# Patient Record
Sex: Male | Born: 1950
Health system: Southern US, Community
[De-identification: ages and names within clinical notes are randomized; demographics above are authoritative.]

## PROBLEM LIST (undated history)

## (undated) DIAGNOSIS — F1011 Alcohol abuse, in remission: Secondary | ICD-10-CM

## (undated) DIAGNOSIS — F329 Major depressive disorder, single episode, unspecified: Secondary | ICD-10-CM

## (undated) DIAGNOSIS — G4700 Insomnia, unspecified: Secondary | ICD-10-CM

## (undated) DIAGNOSIS — B1920 Unspecified viral hepatitis C without hepatic coma: Secondary | ICD-10-CM

## (undated) DIAGNOSIS — B20 Human immunodeficiency virus [HIV] disease: Secondary | ICD-10-CM

## (undated) DIAGNOSIS — C329 Malignant neoplasm of larynx, unspecified: Secondary | ICD-10-CM

## (undated) DIAGNOSIS — R7401 Elevation of levels of liver transaminase levels: Secondary | ICD-10-CM

## (undated) DIAGNOSIS — R740 Nonspecific elevation of levels of transaminase and lactic acid dehydrogenase [LDH]: Secondary | ICD-10-CM

## (undated) DIAGNOSIS — F32A Depression, unspecified: Secondary | ICD-10-CM

## (undated) DIAGNOSIS — Z21 Asymptomatic human immunodeficiency virus [HIV] infection status: Secondary | ICD-10-CM

## (undated) DIAGNOSIS — F419 Anxiety disorder, unspecified: Secondary | ICD-10-CM

## (undated) DIAGNOSIS — R911 Solitary pulmonary nodule: Secondary | ICD-10-CM

## (undated) HISTORY — DX: Solitary pulmonary nodule: R91.1

## (undated) HISTORY — DX: Alcohol abuse, in remission: F10.11

## (undated) HISTORY — DX: Human immunodeficiency virus (HIV) disease: B20

## (undated) HISTORY — DX: Depression, unspecified: F32.A

## (undated) HISTORY — DX: Major depressive disorder, single episode, unspecified: F32.9

## (undated) HISTORY — DX: Elevation of levels of liver transaminase levels: R74.01

## (undated) HISTORY — DX: Nonspecific elevation of levels of transaminase and lactic acid dehydrogenase (ldh): R74.0

## (undated) HISTORY — DX: Insomnia, unspecified: G47.00

## (undated) HISTORY — DX: Asymptomatic human immunodeficiency virus (hiv) infection status: Z21

## (undated) HISTORY — DX: Malignant neoplasm of larynx, unspecified: C32.9

## (undated) HISTORY — PX: HERNIA REPAIR: SHX51

## (undated) HISTORY — DX: Anxiety disorder, unspecified: F41.9

## (undated) HISTORY — DX: Unspecified viral hepatitis C without hepatic coma: B19.20

---

## 2008-03-07 ENCOUNTER — Emergency Department (HOSPITAL_COMMUNITY): Admission: EM | Admit: 2008-03-07 | Discharge: 2008-03-07 | Payer: Self-pay | Admitting: Emergency Medicine

## 2008-03-09 ENCOUNTER — Emergency Department (HOSPITAL_COMMUNITY): Admission: EM | Admit: 2008-03-09 | Discharge: 2008-03-09 | Payer: Self-pay | Admitting: Emergency Medicine

## 2008-06-26 ENCOUNTER — Emergency Department (HOSPITAL_COMMUNITY): Admission: EM | Admit: 2008-06-26 | Discharge: 2008-06-26 | Payer: Self-pay | Admitting: Emergency Medicine

## 2008-09-09 ENCOUNTER — Emergency Department (HOSPITAL_COMMUNITY): Admission: EM | Admit: 2008-09-09 | Discharge: 2008-09-09 | Payer: Self-pay | Admitting: Emergency Medicine

## 2008-11-24 ENCOUNTER — Encounter: Admission: RE | Admit: 2008-11-24 | Discharge: 2008-11-24 | Payer: Self-pay | Admitting: Infectious Diseases

## 2008-11-29 ENCOUNTER — Encounter: Admission: RE | Admit: 2008-11-29 | Discharge: 2008-11-29 | Payer: Self-pay | Admitting: Infectious Diseases

## 2008-11-29 ENCOUNTER — Ambulatory Visit: Payer: Self-pay | Admitting: Internal Medicine

## 2008-11-29 LAB — CONVERTED CEMR LAB
ALT: 14 units/L (ref 0–53)
Absolute CD4: 415 #/uL (ref 381–1469)
CD4 T Helper %: 25 % — ABNORMAL LOW (ref 32–62)
CO2: 23 meq/L (ref 19–32)
Creatinine, Ser: 0.83 mg/dL (ref 0.40–1.50)
GC Probe Amp, Urine: NEGATIVE
HCV Ab: REACTIVE — AB
HIV-1 RNA Quant, Log: 4.17 — ABNORMAL HIGH (ref <1.68)
Hep A IgM: NEGATIVE
Hep B C IgM: NEGATIVE
Hepatitis B Surface Ag: NEGATIVE
Total Bilirubin: 0.3 mg/dL (ref 0.3–1.2)

## 2008-11-30 ENCOUNTER — Encounter: Payer: Self-pay | Admitting: Internal Medicine

## 2008-12-01 ENCOUNTER — Ambulatory Visit: Payer: Self-pay | Admitting: Internal Medicine

## 2008-12-01 ENCOUNTER — Encounter: Payer: Self-pay | Admitting: Internal Medicine

## 2008-12-01 DIAGNOSIS — K4090 Unilateral inguinal hernia, without obstruction or gangrene, not specified as recurrent: Secondary | ICD-10-CM | POA: Insufficient documentation

## 2008-12-01 DIAGNOSIS — F3289 Other specified depressive episodes: Secondary | ICD-10-CM | POA: Insufficient documentation

## 2008-12-01 DIAGNOSIS — F329 Major depressive disorder, single episode, unspecified: Secondary | ICD-10-CM

## 2008-12-01 DIAGNOSIS — J984 Other disorders of lung: Secondary | ICD-10-CM

## 2008-12-05 ENCOUNTER — Ambulatory Visit: Payer: Self-pay | Admitting: *Deleted

## 2008-12-06 ENCOUNTER — Ambulatory Visit (HOSPITAL_COMMUNITY): Admission: RE | Admit: 2008-12-06 | Discharge: 2008-12-06 | Payer: Self-pay | Admitting: Internal Medicine

## 2008-12-07 ENCOUNTER — Telehealth: Payer: Self-pay | Admitting: Internal Medicine

## 2008-12-21 ENCOUNTER — Ambulatory Visit: Payer: Self-pay | Admitting: Internal Medicine

## 2009-01-09 ENCOUNTER — Encounter: Admission: RE | Admit: 2009-01-09 | Discharge: 2009-01-09 | Payer: Self-pay | Admitting: Orthopedic Surgery

## 2009-01-17 ENCOUNTER — Encounter: Payer: Self-pay | Admitting: Internal Medicine

## 2009-02-01 ENCOUNTER — Telehealth: Payer: Self-pay | Admitting: Internal Medicine

## 2009-02-23 ENCOUNTER — Ambulatory Visit: Payer: Self-pay | Admitting: Internal Medicine

## 2009-02-23 ENCOUNTER — Encounter: Payer: Self-pay | Admitting: Internal Medicine

## 2009-02-23 DIAGNOSIS — B1710 Acute hepatitis C without hepatic coma: Secondary | ICD-10-CM | POA: Insufficient documentation

## 2009-02-23 DIAGNOSIS — M25519 Pain in unspecified shoulder: Secondary | ICD-10-CM | POA: Insufficient documentation

## 2009-02-23 LAB — CONVERTED CEMR LAB
ALT: 19 units/L (ref 0–53)
AST: 26 units/L (ref 0–37)
Absolute CD4: 425 #/uL (ref 381–1469)
Albumin: 4.3 g/dL (ref 3.5–5.2)
Alkaline Phosphatase: 77 units/L (ref 39–117)
BUN: 9 mg/dL (ref 6–23)
Basophils Absolute: 0.1 10*3/uL (ref 0.0–0.1)
CD4 T Helper %: 20 % — ABNORMAL LOW (ref 32–62)
Eosinophils Relative: 2 % (ref 0–5)
Lymphocytes Relative: 25 % (ref 12–46)
Lymphs Abs: 2.1 10*3/uL (ref 0.7–4.0)
Neutro Abs: 5.5 10*3/uL (ref 1.7–7.7)
Neutrophils Relative %: 65 % (ref 43–77)
Platelets: 284 10*3/uL (ref 150–400)
Potassium: 4.2 meq/L (ref 3.5–5.3)
RDW: 13.1 % (ref 11.5–15.5)
Sodium: 135 meq/L (ref 135–145)
Total lymphocyte count: 2125 cells/mcL (ref 700–3300)
WBC: 8.5 10*3/uL (ref 4.0–10.5)

## 2009-03-02 ENCOUNTER — Telehealth: Payer: Self-pay | Admitting: Internal Medicine

## 2009-03-07 ENCOUNTER — Ambulatory Visit (HOSPITAL_COMMUNITY): Admission: RE | Admit: 2009-03-07 | Discharge: 2009-03-07 | Payer: Self-pay | Admitting: Internal Medicine

## 2009-03-16 ENCOUNTER — Ambulatory Visit: Payer: Self-pay | Admitting: Internal Medicine

## 2009-03-16 ENCOUNTER — Encounter: Payer: Self-pay | Admitting: Internal Medicine

## 2009-03-16 DIAGNOSIS — R221 Localized swelling, mass and lump, neck: Secondary | ICD-10-CM

## 2009-03-16 DIAGNOSIS — R220 Localized swelling, mass and lump, head: Secondary | ICD-10-CM | POA: Insufficient documentation

## 2009-03-29 ENCOUNTER — Emergency Department (HOSPITAL_COMMUNITY): Admission: EM | Admit: 2009-03-29 | Discharge: 2009-03-29 | Payer: Self-pay | Admitting: Emergency Medicine

## 2009-04-06 ENCOUNTER — Other Ambulatory Visit: Admission: RE | Admit: 2009-04-06 | Discharge: 2009-04-06 | Payer: Self-pay | Admitting: Otolaryngology

## 2009-04-11 ENCOUNTER — Ambulatory Visit (HOSPITAL_COMMUNITY): Admission: RE | Admit: 2009-04-11 | Discharge: 2009-04-11 | Payer: Self-pay | Admitting: Otolaryngology

## 2009-04-28 ENCOUNTER — Encounter: Payer: Self-pay | Admitting: Internal Medicine

## 2009-06-02 HISTORY — PX: OTHER SURGICAL HISTORY: SHX169

## 2009-06-19 ENCOUNTER — Ambulatory Visit (HOSPITAL_COMMUNITY): Admission: RE | Admit: 2009-06-19 | Discharge: 2009-06-19 | Payer: Self-pay | Admitting: Otolaryngology

## 2009-07-06 ENCOUNTER — Encounter: Payer: Self-pay | Admitting: Internal Medicine

## 2009-07-14 ENCOUNTER — Ambulatory Visit: Payer: Self-pay | Admitting: Internal Medicine

## 2009-07-14 ENCOUNTER — Ambulatory Visit: Payer: Self-pay | Admitting: Oncology

## 2009-07-14 ENCOUNTER — Ambulatory Visit: Admission: RE | Admit: 2009-07-14 | Discharge: 2009-08-21 | Payer: Self-pay | Admitting: Radiation Oncology

## 2009-07-14 LAB — CONVERTED CEMR LAB
ALT: 37 units/L (ref 0–53)
Alkaline Phosphatase: 117 units/L (ref 39–117)
Basophils Absolute: 0.1 10*3/uL (ref 0.0–0.1)
Basophils Relative: 1 % (ref 0–1)
HIV 1 RNA Quant: <48 copies/mL (ref <48)
HIV-1 RNA Quant, Log: <1.68 (ref <1.68)
LDL Cholesterol: 78 mg/dL (ref 0–99)
MCHC: 32.8 g/dL (ref 30.0–36.0)
Monocytes Relative: 5 % (ref 3–12)
Neutro Abs: 4.7 10*3/uL (ref 1.7–7.7)
Neutrophils Relative %: 72 % (ref 43–77)
RBC: 4.84 M/uL (ref 4.22–5.81)
RDW: 15.5 % (ref 11.5–15.5)
Sodium: 137 meq/L (ref 135–145)
Total Bilirubin: 0.2 mg/dL — ABNORMAL LOW (ref 0.3–1.2)
Total Protein: 8 g/dL (ref 6.0–8.3)
VLDL: 27 mg/dL (ref 0–40)

## 2009-07-17 LAB — CBC WITH DIFFERENTIAL/PLATELET
BASO%: 0.3 % (ref 0.0–2.0)
Basophils Absolute: 0 10*3/uL (ref 0.0–0.1)
EOS%: 2.4 % (ref 0.0–7.0)
HCT: 45.5 % (ref 38.4–49.9)
HGB: 15.2 g/dL (ref 13.0–17.1)
LYMPH%: 21.9 % (ref 14.0–49.0)
MCH: 31.9 pg (ref 27.2–33.4)
MCHC: 33.4 g/dL (ref 32.0–36.0)
NEUT%: 70.5 % (ref 39.0–75.0)
Platelets: 263 10*3/uL (ref 140–400)

## 2009-07-17 LAB — COMPREHENSIVE METABOLIC PANEL
ALT: 47 U/L (ref 0–53)
AST: 64 U/L — ABNORMAL HIGH (ref 0–37)
BUN: 11 mg/dL (ref 6–23)
CO2: 25 mEq/L (ref 19–32)
Calcium: 9.2 mg/dL (ref 8.4–10.5)
Creatinine, Ser: 0.92 mg/dL (ref 0.40–1.50)
Total Bilirubin: 0.3 mg/dL (ref 0.3–1.2)

## 2009-07-17 LAB — ETHANOL: Alcohol, Ethyl (B): <10 mg/dL (ref 0–10)

## 2009-07-20 ENCOUNTER — Ambulatory Visit (HOSPITAL_COMMUNITY): Admission: RE | Admit: 2009-07-20 | Discharge: 2009-07-20 | Payer: Self-pay | Admitting: Oncology

## 2009-07-24 ENCOUNTER — Ambulatory Visit: Payer: Self-pay | Admitting: Dentistry

## 2009-07-24 ENCOUNTER — Encounter: Admission: AD | Admit: 2009-07-24 | Discharge: 2009-07-24 | Payer: Self-pay | Admitting: Dentistry

## 2009-07-24 ENCOUNTER — Encounter (INDEPENDENT_AMBULATORY_CARE_PROVIDER_SITE_OTHER): Payer: Self-pay | Admitting: *Deleted

## 2009-07-24 ENCOUNTER — Ambulatory Visit: Payer: Self-pay | Admitting: Internal Medicine

## 2009-07-24 DIAGNOSIS — C140 Malignant neoplasm of pharynx, unspecified: Secondary | ICD-10-CM | POA: Insufficient documentation

## 2009-08-02 HISTORY — PX: OTHER SURGICAL HISTORY: SHX169

## 2009-08-09 ENCOUNTER — Encounter: Admission: RE | Admit: 2009-08-09 | Discharge: 2009-11-07 | Payer: Self-pay | Admitting: Otolaryngology

## 2009-08-11 ENCOUNTER — Encounter (INDEPENDENT_AMBULATORY_CARE_PROVIDER_SITE_OTHER): Payer: Self-pay | Admitting: Otolaryngology

## 2009-08-11 ENCOUNTER — Inpatient Hospital Stay (HOSPITAL_COMMUNITY): Admission: RE | Admit: 2009-08-11 | Discharge: 2009-08-20 | Payer: Self-pay | Admitting: Otolaryngology

## 2009-10-02 ENCOUNTER — Ambulatory Visit: Payer: Self-pay | Admitting: Oncology

## 2009-10-16 ENCOUNTER — Ambulatory Visit: Payer: Self-pay | Admitting: Internal Medicine

## 2009-10-16 LAB — CONVERTED CEMR LAB
ALT: 36 units/L (ref 0–53)
AST: 36 units/L (ref 0–37)
Basophils Absolute: 0 10*3/uL (ref 0.0–0.1)
CO2: 26 meq/L (ref 19–32)
Eosinophils Relative: 3 % (ref 0–5)
HCT: 40.3 % (ref 39.0–52.0)
HIV 1 RNA Quant: <48 copies/mL (ref <48)
Lymphocytes Relative: 11 % — ABNORMAL LOW (ref 12–46)
Neutro Abs: 6.5 10*3/uL (ref 1.7–7.7)
Platelets: 293 10*3/uL (ref 150–400)
RDW: 12.9 % (ref 11.5–15.5)
Sodium: 137 meq/L (ref 135–145)
Total Bilirubin: 0.3 mg/dL (ref 0.3–1.2)
Total Protein: 7.8 g/dL (ref 6.0–8.3)

## 2009-10-30 ENCOUNTER — Ambulatory Visit: Payer: Self-pay | Admitting: Internal Medicine

## 2009-10-30 DIAGNOSIS — G4700 Insomnia, unspecified: Secondary | ICD-10-CM

## 2009-10-30 DIAGNOSIS — R21 Rash and other nonspecific skin eruption: Secondary | ICD-10-CM

## 2009-12-19 ENCOUNTER — Encounter: Payer: Self-pay | Admitting: Internal Medicine

## 2009-12-19 ENCOUNTER — Ambulatory Visit: Payer: Self-pay | Admitting: Internal Medicine

## 2010-01-02 HISTORY — PX: OTHER SURGICAL HISTORY: SHX169

## 2010-01-04 ENCOUNTER — Ambulatory Visit (HOSPITAL_COMMUNITY): Admission: RE | Admit: 2010-01-04 | Discharge: 2010-01-05 | Payer: Self-pay | Admitting: Otolaryngology

## 2010-01-29 ENCOUNTER — Ambulatory Visit: Payer: Self-pay | Admitting: Internal Medicine

## 2010-01-29 DIAGNOSIS — N529 Male erectile dysfunction, unspecified: Secondary | ICD-10-CM | POA: Insufficient documentation

## 2010-01-29 LAB — CONVERTED CEMR LAB
ALT: 81 units/L — ABNORMAL HIGH (ref 0–53)
AST: 66 units/L — ABNORMAL HIGH (ref 0–37)
Alkaline Phosphatase: 168 units/L — ABNORMAL HIGH (ref 39–117)
Basophils Absolute: 0 10*3/uL (ref 0.0–0.1)
Basophils Relative: 1 % (ref 0–1)
Creatinine, Ser: 0.79 mg/dL (ref 0.40–1.50)
Eosinophils Absolute: 0.2 10*3/uL (ref 0.0–0.7)
Eosinophils Relative: 3 % (ref 0–5)
HCT: 41 % (ref 39.0–52.0)
Hemoglobin: 13.7 g/dL (ref 13.0–17.0)
MCHC: 33.4 g/dL (ref 30.0–36.0)
MCV: 96.2 fL (ref 78.0–100.0)
Monocytes Absolute: 0.5 10*3/uL (ref 0.1–1.0)
Neutro Abs: 3.2 10*3/uL (ref 1.7–7.7)
RDW: 14.5 % (ref 11.5–15.5)
Total Bilirubin: 0.4 mg/dL (ref 0.3–1.2)

## 2010-02-04 ENCOUNTER — Inpatient Hospital Stay (HOSPITAL_COMMUNITY)
Admission: EM | Admit: 2010-02-04 | Discharge: 2010-02-09 | Payer: Self-pay | Source: Home / Self Care | Attending: Internal Medicine | Admitting: Internal Medicine

## 2010-02-05 ENCOUNTER — Encounter: Payer: Self-pay | Admitting: Internal Medicine

## 2010-02-09 ENCOUNTER — Encounter: Payer: Self-pay | Admitting: Internal Medicine

## 2010-02-21 ENCOUNTER — Encounter: Payer: Self-pay | Admitting: Internal Medicine

## 2010-02-21 ENCOUNTER — Ambulatory Visit: Payer: Self-pay | Admitting: Internal Medicine

## 2010-02-21 DIAGNOSIS — S2239XA Fracture of one rib, unspecified side, initial encounter for closed fracture: Secondary | ICD-10-CM | POA: Insufficient documentation

## 2010-02-21 DIAGNOSIS — Z9189 Other specified personal risk factors, not elsewhere classified: Secondary | ICD-10-CM | POA: Insufficient documentation

## 2010-02-21 LAB — CONVERTED CEMR LAB
Amphetamine Screen, Ur: NEGATIVE
Cocaine Metabolites: NEGATIVE
Marijuana Metabolite: NEGATIVE
Opiates: POSITIVE — AB
Phencyclidine (PCP): NEGATIVE
Propoxyphene: NEGATIVE

## 2010-03-07 ENCOUNTER — Encounter: Payer: Self-pay | Admitting: Internal Medicine

## 2010-03-08 ENCOUNTER — Encounter: Payer: Self-pay | Admitting: Licensed Clinical Social Worker

## 2010-03-09 ENCOUNTER — Ambulatory Visit
Admission: RE | Admit: 2010-03-09 | Discharge: 2010-03-09 | Payer: Self-pay | Source: Home / Self Care | Attending: Internal Medicine | Admitting: Internal Medicine

## 2010-03-22 ENCOUNTER — Telehealth: Payer: Self-pay | Admitting: Internal Medicine

## 2010-03-23 ENCOUNTER — Ambulatory Visit
Admission: RE | Admit: 2010-03-23 | Discharge: 2010-03-23 | Payer: Self-pay | Source: Home / Self Care | Attending: Internal Medicine | Admitting: Internal Medicine

## 2010-03-23 ENCOUNTER — Encounter: Payer: Self-pay | Admitting: Internal Medicine

## 2010-03-25 ENCOUNTER — Encounter: Payer: Self-pay | Admitting: Radiation Oncology

## 2010-04-02 LAB — CONVERTED CEMR LAB
Amphetamine Screen, Ur: NEGATIVE
Barbiturate Quant, Ur: NEGATIVE
Cocaine Metabolites: NEGATIVE
Marijuana Metabolite: NEGATIVE
Opiates: NEGATIVE

## 2010-04-03 NOTE — Miscellaneous (Signed)
Summary: Office Visit ([facility])    Vital Signs:  Patient profile:   60 year old male Weight:      139.8 pounds Temp:     97.9 degrees F oral Pulse rate:   73 / minute Pulse rhythm:   regular Resp:     20 per minute BP sitting:   133 / 90  (left arm)  Vitals Entered By: Sharen Heck RN (March 16, 2009 3:23 PM) CC: f/u 05, wants to discuus 05 meds, probs with stinging in throat whwn swallowing, reports intermitten bil arm pain Pain Assessment Patient in pain? yes     Location: arms Intensity: 6 Type: aching  Does patient need assistance? Functional Status Self care Ambulation Normal   CC:  f/u 05, wants to discuus 05 meds, probs with stinging in throat whwn swallowing, and reports intermitten bil arm pain.  History of Present Illness: Pt here for PET scan results and genotype result.s  He c/o pain on the left side of his throat. No fever. he is ready to get started on HIV meds. He states that his sputum for AFB x 3 was negative.  Preventive Screening-Counseling & Management  Alcohol-Tobacco     Alcohol drinks/day: 2     Alcohol type: beer     Alcohol Counseling: to decrease amount and/or frequency of alcohol intake     Smoking Status: current     Packs/Day: 1.0     Pack years: 45 years     Passive Smoke Exposure: No  Caffeine-Diet-Exercise     Caffeine use/day: 5 cups     Does Patient Exercise: no  Allergies (verified): No Known Drug Allergies   Review of Systems  The patient denies anorexia, fever, and weight loss.     Physical Exam  General:  alert, well-developed, well-nourished, and well-hydrated.   Head:  normocephalic and no abnormalities observed.   Mouth:  pharynx pink and moist.  no thrush  Neck:  fullness left side of neck Lungs:  normal breath sounds.     Impression & Recommendations:  Problem # 1:  POSITIVE PPD (ICD-795.5) no sign of active disease health dept following  Problem # 2:  Hosp for HIV INFECTION, ASYMPTOMATIC  (ICD-V08)  genotype negative for resistance.  Pt ready to start meds.  Treatment options discussed.  Will start Atripla.  potential side effects discussed. Will return in 6 weeks for labs.  Diagnostics Reviewed:  WBC: 8.5 (02/23/2009)   Hgb: 16.7 (02/23/2009)   HCT: 49.2 (02/23/2009)   Platelets: 284 (02/23/2009) HIV genotype: See Comment (02/23/2009)   HIV-1 RNA: 17200 (02/23/2009)   HBSAg: NEG (11/29/2008)  Orders: Est. Patient Level III (65784)  Problem # 3:  NECK MASS (ICD-784.2)  with increased activity on PET scan. Will refer to ENT   Orders: Est. Patient Level III (69629) ENT Referral (ENT)  Medications Added to Medication List This Visit: 1)  Atripla 600-200-300 Mg Tabs (Efavirenz-emtricitab-tenofovir) .... Take 1 tablet by mouth at bedtime   Patient Instructions: 1)  Please schedule a follow-up appointment in 6 weeks. Prescriptions: ATRIPLA 600-200-300 MG TABS (EFAVIRENZ-EMTRICITAB-TENOFOVIR) Take 1 tablet by mouth at bedtime  #30 x 5   Entered and Authorized by:   Yisroel Ramming MD   Signed by:   Yisroel Ramming MD on 03/16/2009   Method used:   Print then Give to Patient   RxID:   [id number]

## 2010-04-03 NOTE — Miscellaneous (Signed)
Summary: Orders Update - labs  Clinical Lists Changes  Problems: Added new problem of ENCOUNTER FOR LONG-TERM USE OF OTHER MEDICATIONS (ICD-V58.69) Orders: Added new Test order of T-CBC w/Diff [phone]) - Signed Added new Test order of T-CD4SP [facility]) (CD4SP) - Signed Added new Test order of T-Comprehensive Metabolic Panel [phone]) - Signed Added new Test order of T-HIV Viral Load [phone]) - Signed Added new Test order of T-RPR (Syphilis) [phone]) - Signed Added new Test order of T-Lipid Profile ([id number]) - Signed     Process Orders Check Orders Results:     [facility]: ABN not required for this insurance Order queued for requisitioning for [facility]: Jul 06, 2009 5:08 PM  Tests Sent for requisitioning (Jul 06, 2009 5:08 PM):     07/10/2009: [facility] -- T-CBC w/Diff [[id number]] (signed)     07/10/2009: [facility] -- T-Comprehensive Metabolic Panel [[id number]] (signed)     07/10/2009: [facility] -- T-HIV Viral Load [phone] (signed)     07/10/2009: [facility] -- T-RPR (Syphilis) [phone] (signed)     07/10/2009: [facility] -- T-Lipid Profile [phone] (signed)

## 2010-04-03 NOTE — Letter (Signed)
Summary: [facility]   Imported By: Percell Miller 04/28/2009 14:59:25  _____________________________________________________________________  External Attachment:    Type:   Image     Comment:   External Document

## 2010-04-03 NOTE — Letter (Signed)
Summary: Internal Correspondence - Referral  Internal Correspondence - Referral   Imported By: Paula Libra 03/23/2009 15:34:22  _____________________________________________________________________  External Attachment:    Type:   Image     Comment:   External Document

## 2010-04-03 NOTE — Assessment & Plan Note (Signed)
Summary: F/U/VS   CC:  follow-up visit, lab results, and c/o trouble sleeping even with Ambien.  History of Present Illness: Pt here for lab results.He had his larynx removed due to the cancer.  No further chemo or RT is planned.  He is having trouble sleeping and the Remus Loffler is not working. He also has a pruritic rash on his hands and feet.  Preventive Screening-Counseling & Management  Alcohol-Tobacco     Alcohol drinks/day: 2     Alcohol type: beer     Alcohol Counseling: to decrease amount and/or frequency of alcohol intake     Smoking Status: current     Packs/Day: 1.0     Pack years: 45 years     Passive Smoke Exposure: No  Caffeine-Diet-Exercise     Caffeine use/day: 5 cups     Does Patient Exercise: no  Safety-Violence-Falls     Seat Belt Use: yes      Drug Use:  yes.    Comments: pt. given condoms   Updated Prior Medication List: ZOLOFT 50 MG TABS (SERTRALINE HCL) Take 1 tablet by mouth once a day NAPROSYN 500 MG TABS (NAPROXEN) Take 1 tablet by mouth two times a day as needed ULTRAM 50 MG TABS (TRAMADOL HCL) Take 1 tablet by mouth every 8 hours as needed ATRIPLA 600-200-300 MG TABS (EFAVIRENZ-EMTRICITAB-TENOFOVIR) Take 1 tablet by mouth at bedtime LORAZEPAM 0.5 MG TABS (LORAZEPAM) take one tablet by mouth every six hours as needed for nausea and vomiting PROCHLORPERAZINE MALEATE 10 MG TABS (PROCHLORPERAZINE MALEATE) take one tablet by mouth every six hours as needed for nausea and vomiting ONDANSETRON HCL 4 MG TABS (ONDANSETRON HCL) take one tablet by mouth every 12 hours as needed for nausea and vomiting ENSURE  LIQD (NUTRITIONAL SUPPLEMENTS) one can two times a day VALIUM 5 MG TABS (DIAZEPAM) Take 1 tablet by mouth at bedtime LOTRISONE 1-0.05 % CREA (CLOTRIMAZOLE-BETAMETHASONE) apply two times a day  Current Allergies (reviewed today): No known allergies  Past History:  Past Medical History: Last updated: 02/23/2009 Hepatitis C  Review of  Systems  The patient denies fever, chest pain, and dyspnea on exertion.    Vital Signs:  Patient profile:   60 year old male Height:      63 inches (160.02 cm) Weight:      137.8 pounds (62.64 kg) BMI:     24.50 Temp:     97.8 degrees F (36.56 degrees C) oral BP sitting:   144 / 87  (right arm)  Vitals Entered By: Wendall Mola CMA Duncan Dull) (October 30, 2009 2:04 PM) CC: follow-up visit, lab results, c/o trouble sleeping even with Ambien Is Patient Diabetic? No Pain Assessment Patient in pain? no      Nutritional Status BMI of 19 -24 = normal Nutritional Status Detail appetite "good"  Does patient need assistance? Functional Status Self care Ambulation Normal Comments no missed doses of meds per patient pt. requesting Valium in place of Ambien   Physical Exam  General:  alert, well-developed, well-nourished, and well-hydrated.   Head:  normocephalic and atraumatic.   Mouth:  pharynx pink and moist.   Lungs:  normal breath sounds.      Impression & Recommendations:  Problem # 1:  Hosp for HIV INFECTION, ASYMPTOMATIC (ICD-V08) Pt.s most recent CD4ct was 220 and VL <48 .  Pt instructed to continue the current antiretroviral regimen.  Pt encouraged to take medication regularly and not miss doses.  Pt will f/u in 3 months  for repeat blood work and will see me 2 weeks later.  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 Apply two times a day  Problem # 4:  INSOMNIA (ICD-780.52) trial of  valium  Medications Added to Medication List This Visit: 1)  Valium 5 Mg Tabs (Diazepam) .... Take 1 tablet by mouth at bedtime 2)  Lotrisone 1-0.05 % Crea (Clotrimazole-betamethasone) .... Apply two times a day  Other Orders: Est. Patient Level III (16109) Future Orders: T-CD4SP ([facility]) (CD4SP) ... 01/28/2010 T-HIV Viral Load [phone]) ... 01/28/2010 T-Comprehensive Metabolic Panel [phone]) ... 01/28/2010 T-CBC w/Diff ([id number]) ... 01/28/2010  Patient Instructions: 1)  Please schedule a follow-up appointment in 3 months, 2 weeks afte rlabs.  Prescriptions: LOTRISONE 1-0.05 % CREA (CLOTRIMAZOLE-BETAMETHASONE) apply two times a day  #60gm x 0   Entered and Authorized by:   Yisroel Ramming MD   Signed by:   Yisroel Ramming MD on 10/30/2009   Method used:   Print then Give to Patient   RxID:   [id number] VALIUM 5 MG TABS (DIAZEPAM) Take 1 tablet by mouth at bedtime  #30 x 5   Entered and Authorized by:   Yisroel Ramming MD   Signed by:   Yisroel Ramming MD on 10/30/2009   Method used:   Print then Give to Patient   RxID:   [id number]

## 2010-04-03 NOTE — Miscellaneous (Signed)
Summary: clinical update/ryan white  Clinical Lists Changes  Observations: Added new observation of AIDSDAP: Yes 2011 (07/24/2009 10:42) Added new observation of PCTFPL: 67.97  (07/24/2009 10:42) Added new observation of INCOMESOURCE: Unemployment  (07/24/2009 10:42) Added new observation of HOUSEINCOME: 7361  (07/24/2009 10:42) Added new observation of FAMILYSIZE: 1  (07/24/2009 10:42) Added new observation of HOUSING: Stable/permanent  (07/24/2009 10:42) Added new observation of FINASSESSDT: 07/24/2009  (07/24/2009 10:42) Added new observation of CASE MGM: Tammi  (07/24/2009 10:42)

## 2010-04-03 NOTE — Assessment & Plan Note (Signed)
Summary: Nurse Visit  - Flu vaccine   Vital Signs:  Patient profile:   60 year old male Height:      63 inches (160.02 cm) Weight:      145 pounds (65.91 kg) BMI:     25.78  Vitals Entered By: Jennet Maduro RN (December 19, 2009 2:29 PM) Prior Medications: ZOLOFT 50 MG TABS (SERTRALINE HCL) Take 1 tablet by mouth once a day NAPROSYN 500 MG TABS (NAPROXEN) Take 1 tablet by mouth two times a day as needed ULTRAM 50 MG TABS (TRAMADOL HCL) Take 1 tablet by mouth every 8 hours as needed ATRIPLA 600-200-300 MG TABS (EFAVIRENZ-EMTRICITAB-TENOFOVIR) Take 1 tablet by mouth at bedtime LORAZEPAM 0.5 MG TABS (LORAZEPAM) take one tablet by mouth every six hours as needed for nausea and vomiting PROCHLORPERAZINE MALEATE 10 MG TABS (PROCHLORPERAZINE MALEATE) take one tablet by mouth every six hours as needed for nausea and vomiting ONDANSETRON HCL 4 MG TABS (ONDANSETRON HCL) take one tablet by mouth every 12 hours as needed for nausea and vomiting ENSURE  LIQD (NUTRITIONAL SUPPLEMENTS) one can two times a day VALIUM 5 MG TABS (DIAZEPAM) Take 1 tablet by mouth at bedtime LOTRISONE 1-0.05 % CREA (CLOTRIMAZOLE-BETAMETHASONE) apply two times a day Current Allergies: No known allergies  Immunizations Administered:  Influenza Vaccine # 1:    Vaccine Type: Fluvax Non-MCR    Site: left deltoid    Mfr: novartis    Dose: 0.5 ml    Route: IM    Given by: Jennet Maduro RN    Exp. Date: 06/03/2010    Lot #: [id number]  Flu Vaccine Consent Questions:    Do you have a history of severe allergic reactions to this vaccine? no    Any prior history of allergic reactions to egg and/or gelatin? no    Do you have a sensitivity to the preservative Thimersol? no    Do you have a past history of Guillan-Barre Syndrome? no    Do you currently have an acute febrile illness? no    Have you ever had a severe reaction to latex? no    Vaccine information given and explained to patient? yes  Orders Added: 1)   Influenza Vaccine NON MCR [00028]

## 2010-04-03 NOTE — Assessment & Plan Note (Signed)
Summary: F/U/VS   CC:  follow-up visit and lab results.  History of Present Illness: Since his last visit pt diagnosed with throat cancer.  I sent him to ENT after a PET scan showed a possible mass in his throat.  He underwent biopsy and was diagnosed with CA. He is undergoing RT and Chemo. It has been rough going since he does not have a stable place to stay.  He has lost weight and would like some ensure.  He has been taking his Atripla.  Preventive Screening-Counseling & Management  Alcohol-Tobacco     Alcohol drinks/day: 2     Alcohol type: beer     Alcohol Counseling: to decrease amount and/or frequency of alcohol intake     Smoking Status: current     Packs/Day: 1.0     Pack years: 45 years     Passive Smoke Exposure: No  Caffeine-Diet-Exercise     Caffeine use/day: 5 cups     Does Patient Exercise: no  Safety-Violence-Falls     Seat Belt Use: yes      Drug Use:  yes.     Updated Prior Medication List: ZOLOFT 50 MG TABS (SERTRALINE HCL) Take 1 tablet by mouth once a day NAPROSYN 500 MG TABS (NAPROXEN) Take 1 tablet by mouth two times a day as needed ULTRAM 50 MG TABS (TRAMADOL HCL) Take 1 tablet by mouth every 8 hours as needed ATRIPLA 600-200-300 MG TABS (EFAVIRENZ-EMTRICITAB-TENOFOVIR) Take 1 tablet by mouth at bedtime LORAZEPAM 0.5 MG TABS (LORAZEPAM) take one tablet by mouth every six hours as needed for nausea and vomiting PROCHLORPERAZINE MALEATE 10 MG TABS (PROCHLORPERAZINE MALEATE) take one tablet by mouth every six hours as needed for nausea and vomiting ONDANSETRON HCL 4 MG TABS (ONDANSETRON HCL) take one tablet by mouth every 12 hours as needed for nausea and vomiting HYDROCODONE-IBUPROFEN 7.5-200 MG TABS (HYDROCODONE-IBUPROFEN) take one tablet by mouth every 4 to 6 hours as needed ACIPHEX 20 MG TBEC (RABEPRAZOLE SODIUM) take one tablet by mouth one time a day ENSURE  LIQD (NUTRITIONAL SUPPLEMENTS) one can two times a day  Current Allergies (reviewed  today): No known allergies  Past History:  Past Medical History: Last updated: 02/23/2009 Hepatitis C  Review of Systems       The patient complains of weight loss.  The patient denies anorexia, fever, and abdominal pain.    Vital Signs:  Patient profile:   60 year old male Height:      63 inches (160.02 cm) Weight:      127.8 pounds (58.09 kg) BMI:     22.72 Temp:     98.4 degrees F (36.89 degrees C) oral Pulse rate:   76 / minute BP sitting:   131 / 86  (right arm)  Vitals Entered By: Wendall Mola CMA Duncan Dull) (Jul 24, 2009 9:57 AM) CC: follow-up visit, lab results Is Patient Diabetic? No Pain Assessment Patient in pain? yes     Location: legs Intensity: 8 Type: aching Onset of pain  Constant Nutritional Status BMI of 19 -24 = normal Nutritional Status Detail appetite "not good"  Does patient need assistance? Functional Status Self care Ambulation Normal Comments no missed doses of meds per patient   Physical Exam  General:  alert, well-developed, well-nourished, and well-hydrated.   Head:  normocephalic and atraumatic.   Mouth:  pharynx pink and moist.   Lungs:  normal breath sounds.      Impression & Recommendations:  Problem # 1:  Hosp for HIV INFECTION, ASYMPTOMATIC (ICD-V08) Pt.s most recent CD4ct was 280 and VL <48 .  Pt instructed to continue the current antiretroviral regimen.  Pt encouraged to take medication regularly and not miss doses.  Pt will f/u in 3 months for repeat blood work and will see me 2 weeks later.  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 Pt referred to [facility] for help with housing and ensure.  Medications Added to  Medication List This Visit: 1)  Lorazepam 0.5 Mg Tabs (Lorazepam) .... Take one tablet by mouth every six hours as needed for nausea and vomiting 2)  Prochlorperazine Maleate 10 Mg Tabs (Prochlorperazine maleate) .... Take one tablet by mouth every six hours as needed for nausea and vomiting 3)  Ondansetron Hcl 4 Mg Tabs (Ondansetron hcl) .... Take one tablet by mouth every 12 hours as needed for nausea and vomiting 4)  Hydrocodone-ibuprofen 7.5-200 Mg Tabs (Hydrocodone-ibuprofen) .... Take one tablet by mouth every 4 to 6 hours as needed 5)  Aciphex 20 Mg Tbec (Rabeprazole sodium) .... Take one tablet by mouth one time a day 6)  Ensure Liqd (Nutritional supplements) .... One can two times a day  Other Orders: Est. Patient Level III (30865) Future Orders: T-CD4SP ([facility]) (CD4SP) ... 10/22/2009 T-HIV Viral Load [phone]) ... 10/22/2009 T-Comprehensive Metabolic Panel [phone]) ... 10/22/2009 T-CBC w/Diff ([id number]) ... 10/22/2009  Patient Instructions: 1)  Please schedule a follow-up appointment in 3 months, 2 weeks after labs.  Prescriptions: ENSURE  LIQD (NUTRITIONAL SUPPLEMENTS) one can two times a day  #2 cases x prn   Entered and Authorized by:   Yisroel Ramming MD   Signed by:   Yisroel Ramming MD on 07/24/2009   Method used:   Print then Give to Patient   RxID:   [id number]
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
## 2010-04-03 NOTE — Assessment & Plan Note (Signed)
Summary: can't sleep, and wants viagra   CC:  pt. c/o trouble sleeping and needing more Valium to sleep and requesting Rx for Viagra.  History of Present Illness: Pt having trouble sleeping. Valium not helping any longer. he would like to try Xanax for sleep patient is going to undergo further surgery to close his tracheotomy.  Preventive Screening-Counseling & Management  Alcohol-Tobacco     Alcohol drinks/day: 2     Alcohol type: beer     Alcohol Counseling: to decrease amount and/or frequency of alcohol intake     Smoking Status: quit > 6 months     Pack years: 45 years     Cigars/week: cigars     Passive Smoke Exposure: No  Caffeine-Diet-Exercise     Caffeine use/day: 5 cups     Does Patient Exercise: no  Safety-Violence-Falls     Seat Belt Use: yes      Drug Use:  yes.    Comments: pt. given condoms   Updated Prior Medication List: ATRIPLA 600-200-300 MG TABS (EFAVIRENZ-EMTRICITAB-TENOFOVIR) Take 1 tablet by mouth at bedtime ENSURE  LIQD (NUTRITIONAL SUPPLEMENTS) one can two times a day LOTRISONE 1-0.05 % CREA (CLOTRIMAZOLE-BETAMETHASONE) apply two times a day ALPRAZOLAM 1 MG TABS (ALPRAZOLAM) Take 1 tablet by mouth every 8 hours as needed PERCOCET 5-325 MG TABS (OXYCODONE-ACETAMINOPHEN) Take 1 tablet by mouth every 8 hours as needed VIAGRA 50 MG TABS (SILDENAFIL CITRATE) take as directed  Current Allergies (reviewed today): No known allergies  Past History:  Past Medical History: Last updated: 02/23/2009 Hepatitis C  Review of Systems  The patient denies anorexia, fever, and weight loss.    Vital Signs:  Patient profile:   60 year old male Height:      63 inches (160.02 cm) Weight:      149.0 pounds (67.73 kg) BMI:     26.49 Temp:     98.3 degrees F (36.83 degrees C) oral Pulse rate:   80 / minute BP sitting:   121 / 76  (right arm)  Vitals Entered By: Wendall Mola CMA Duncan Dull) (January 29, 2010 2:02 PM) CC: pt. c/o trouble sleeping,  needing more Valium to sleep and requesting Rx for Viagra Is Patient Diabetic? No Pain Assessment Patient in pain? yes     Location: neck Intensity: 7 Type: dull Onset of pain  Constant Nutritional Status BMI of 25 - 29 = overweight Nutritional Status Detail appetite "good"  Have you ever been in a relationship where you felt threatened, hurt or afraid?No   Does patient need assistance? Functional Status Self care Ambulation Normal Comments no missed doses of Atripla per pt.   Physical Exam  General:  alert, well-developed, well-nourished, and well-hydrated.   Head:  normocephalic and atraumatic.   Mouth:  pharynx pink and moist.   Lungs:  normal breath sounds.     Impression & Recommendations:  Problem # 1:  INSOMNIA (ICD-780.52) trial of xanax Orders: Est. Patient Level III (16109)  Problem # 2:  ERECTILE DYSFUNCTION, ORGANIC (ICD-607.84)  His updated medication list for this problem includes:    Viagra 50 Mg Tabs (Sildenafil citrate) .Marland Kitchen... Take as directed  Orders: Est. 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 Take 1 tablet by mouth every 8 hours as needed 2)  Percocet 5-325 Mg Tabs (Oxycodone-acetaminophen) .... Take 1 tablet by  mouth every 8 hours as needed 3)  Viagra 50 Mg Tabs (Sildenafil citrate) .... Take as directed Prescriptions: VIAGRA 50 MG TABS (SILDENAFIL CITRATE) take as directed  #10 x 5   Entered and Authorized by:   Yisroel Ramming MD   Signed by:   Yisroel Ramming MD on 01/29/2010   Method used:   Print then Give to Patient   RxID:   [id number] PERCOCET 5-325 MG TABS (OXYCODONE-ACETAMINOPHEN) Take 1 tablet by mouth every 8 hours as needed  #90 x 0   Entered and Authorized by:   Yisroel Ramming MD   Signed by:   Yisroel Ramming MD on 01/29/2010   Method used:   Print then Give to  Patient   RxID:   [id number] LOTRISONE 1-0.05 % CREA (CLOTRIMAZOLE-BETAMETHASONE) apply two times a day  #60gm x 0   Entered and Authorized by:   Yisroel Ramming MD   Signed by:   Yisroel Ramming MD on 01/29/2010   Method used:   Print then Give to Patient   RxID:   [id number] ALPRAZOLAM 1 MG TABS (ALPRAZOLAM) Take 1 tablet by mouth every 8 hours as needed  #90 x 3   Entered and Authorized by:   Yisroel Ramming MD   Signed by:   Yisroel Ramming MD on 01/29/2010   Method used:   Print then Give to Patient   RxID:   [id number]      Immunization History:  Pneumovax Immunization History:    Pneumovax:  historical (12/13/2009)

## 2010-04-03 NOTE — Miscellaneous (Signed)
Summary: HIPAA Restrictions  HIPAA Restrictions   Imported By: Florinda Marker 07/14/2009 15:30:04  _____________________________________________________________________  External Attachment:    Type:   Image     Comment:   External Document
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
## 2010-04-05 NOTE — Miscellaneous (Signed)
Summary: [facility]: Home Health Cert. & Plan Of Care  [facility]: [facility] Health Cert[facility] & Plan Of Care   Imported By: Florinda Marker 03/26/2010 09:47:25  _____________________________________________________________________  External Attachment:    Type:   Image     Comment:   External Document
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
## 2010-04-05 NOTE — Miscellaneous (Signed)
Summary: Soc. Work  Product manager and PT/OT was ordered at hospital D/C and services began 12/13 and have been completed.  I asked [facility] to send the notes to [facility] because we have nothing on the EMR to show what was done.

## 2010-04-05 NOTE — Medication Information (Signed)
Summary: CONTROLLED MEDICATION  CONTROLLED MEDICATION   Imported By: Margie Billet 02/28/2010 13:34:20  _____________________________________________________________________  External Attachment:    Type:   Image     Comment:   External Document
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
## 2010-04-05 NOTE — Medication Information (Signed)
Summary: PERCOCET  PERCOCET   Imported By: Margie Billet 03/27/2010 11:18:38  _____________________________________________________________________  External Attachment:    Type:   Image     Comment:   External Document

## 2010-04-23 ENCOUNTER — Encounter: Payer: Self-pay | Admitting: Infectious Diseases

## 2010-04-23 ENCOUNTER — Encounter (INDEPENDENT_AMBULATORY_CARE_PROVIDER_SITE_OTHER): Payer: Self-pay | Admitting: *Deleted

## 2010-04-23 ENCOUNTER — Other Ambulatory Visit: Payer: Self-pay | Admitting: *Deleted

## 2010-04-23 MED ORDER — OXYCODONE-ACETAMINOPHEN 5-325 MG PO TABS: 1.0000 | ORAL_TABLET | Freq: Three times a day (TID) | ORAL | Status: AC | PRN

## 2010-05-01 NOTE — Miscellaneous (Signed)
  Clinical Lists Changes  Observations: Added new observation of FINASSESSDT: 02/23/2010 (04/23/2010 12:13) Added new observation of YEARLYEXPEN: 0  (04/23/2010 12:13) Added new observation of AIDSDAP: No  (04/23/2010 12:13) Added new observation of PAYOR: Medicaid  (04/23/2010 12:13)      patient has medicaid - issued Feb 23, 2010

## 2010-05-09 ENCOUNTER — Encounter: Payer: Self-pay | Admitting: *Deleted

## 2010-05-10 NOTE — Medication Information (Signed)
Summary: Tax adviser   Imported By: Florinda Marker 05/01/2010 11:04:52  _____________________________________________________________________  External Attachment:    Type:   Image     Comment:   External Document

## 2010-05-14 ENCOUNTER — Encounter: Payer: Self-pay | Admitting: Adult Health

## 2010-05-14 ENCOUNTER — Other Ambulatory Visit: Payer: Self-pay | Admitting: Adult Health

## 2010-05-14 ENCOUNTER — Other Ambulatory Visit (INDEPENDENT_AMBULATORY_CARE_PROVIDER_SITE_OTHER): Payer: Medicaid Other

## 2010-05-14 DIAGNOSIS — B20 Human immunodeficiency virus [HIV] disease: Secondary | ICD-10-CM | POA: Insufficient documentation

## 2010-05-14 LAB — CONVERTED CEMR LAB
ALT: 98 units/L — ABNORMAL HIGH (ref 0–53)
AST: 95 units/L — ABNORMAL HIGH (ref 0–37)
Alkaline Phosphatase: 223 units/L — ABNORMAL HIGH (ref 39–117)
Basophils Relative: 1 % (ref 0–1)
CO2: 25 meq/L (ref 19–32)
Creatinine, Ser: 0.8 mg/dL (ref 0.40–1.50)
Eosinophils Absolute: 0.1 10*3/uL (ref 0.0–0.7)
Eosinophils Relative: 2 % (ref 0–5)
HCT: 42.9 % (ref 39.0–52.0)
HIV-1 RNA Quant, Log: 1.49 — ABNORMAL HIGH (ref <1.30)
Lymphs Abs: 1.4 10*3/uL (ref 0.7–4.0)
MCHC: 34.7 g/dL (ref 30.0–36.0)
MCV: 93.9 fL (ref 78.0–100.0)
Neutrophils Relative %: 68 % (ref 43–77)
Platelets: 250 10*3/uL (ref 150–400)
RDW: 13.2 % (ref 11.5–15.5)
Sodium: 138 meq/L (ref 135–145)
Total Bilirubin: 0.4 mg/dL (ref 0.3–1.2)
Total Protein: 7.9 g/dL (ref 6.0–8.3)

## 2010-05-15 ENCOUNTER — Encounter: Payer: Self-pay | Admitting: *Deleted

## 2010-05-15 LAB — CBC
HCT: 33.5 % — ABNORMAL LOW (ref 39.0–52.0)
HCT: 33.7 % — ABNORMAL LOW (ref 39.0–52.0)
HCT: 35.4 % — ABNORMAL LOW (ref 39.0–52.0)
HCT: 39.9 % (ref 39.0–52.0)
HCT: 40.5 % (ref 39.0–52.0)
Hemoglobin: 11.5 g/dL — ABNORMAL LOW (ref 13.0–17.0)
Hemoglobin: 11.7 g/dL — ABNORMAL LOW (ref 13.0–17.0)
Hemoglobin: 12.1 g/dL — ABNORMAL LOW (ref 13.0–17.0)
Hemoglobin: 12.9 g/dL — ABNORMAL LOW (ref 13.0–17.0)
Hemoglobin: 13.3 g/dL (ref 13.0–17.0)
Hemoglobin: 13.8 g/dL (ref 13.0–17.0)
MCH: 28.9 pg (ref 26.0–34.0)
MCHC: 32.8 g/dL (ref 30.0–36.0)
MCHC: 34.6 g/dL (ref 30.0–36.0)
MCHC: 35.1 g/dL (ref 30.0–36.0)
MCV: 87.9 fL (ref 78.0–100.0)
MCV: 93.7 fL (ref 78.0–100.0)
RBC: 3.78 MIL/uL — ABNORMAL LOW (ref 4.22–5.81)
RBC: 4.61 MIL/uL (ref 4.22–5.81)
RDW: 13 % (ref 11.5–15.5)
RDW: 13.6 % (ref 11.5–15.5)
RDW: 13.9 % (ref 11.5–15.5)
RDW: 14.1 % (ref 11.5–15.5)
WBC: 11.8 10*3/uL — ABNORMAL HIGH (ref 4.0–10.5)
WBC: 12.2 10*3/uL — ABNORMAL HIGH (ref 4.0–10.5)
WBC: 7.2 10*3/uL (ref 4.0–10.5)
WBC: 9 10*3/uL (ref 4.0–10.5)

## 2010-05-15 LAB — COMPREHENSIVE METABOLIC PANEL
ALT: 68 U/L — ABNORMAL HIGH (ref 0–53)
ALT: 71 U/L — ABNORMAL HIGH (ref 0–53)
ALT: 91 U/L — ABNORMAL HIGH (ref 0–53)
AST: 146 U/L — ABNORMAL HIGH (ref 0–37)
AST: 64 U/L — ABNORMAL HIGH (ref 0–37)
AST: 94 U/L — ABNORMAL HIGH (ref 0–37)
Albumin: 3.6 g/dL (ref 3.5–5.2)
Alkaline Phosphatase: 132 U/L — ABNORMAL HIGH (ref 39–117)
Alkaline Phosphatase: 153 U/L — ABNORMAL HIGH (ref 39–117)
CO2: 24 mEq/L (ref 19–32)
CO2: 27 mEq/L (ref 19–32)
Calcium: 8.6 mg/dL (ref 8.4–10.5)
Calcium: 9 mg/dL (ref 8.4–10.5)
Calcium: 9.8 mg/dL (ref 8.4–10.5)
Chloride: 111 mEq/L (ref 96–112)
Creatinine, Ser: 1.28 mg/dL (ref 0.4–1.5)
GFR calc Af Amer: >60 mL/min (ref >60)
GFR calc Af Amer: >60 mL/min (ref >60)
GFR calc non Af Amer: 58 mL/min — ABNORMAL LOW (ref >60)
Glucose, Bld: 134 mg/dL — ABNORMAL HIGH (ref 70–99)
Glucose, Bld: 90 mg/dL (ref 70–99)
Glucose, Bld: 99 mg/dL (ref 70–99)
Potassium: 3.8 mEq/L (ref 3.5–5.1)
Potassium: 4.2 mEq/L (ref 3.5–5.1)
Sodium: 133 mEq/L — ABNORMAL LOW (ref 135–145)
Sodium: 136 mEq/L (ref 135–145)
Sodium: 136 mEq/L (ref 135–145)
Total Bilirubin: 0.7 mg/dL (ref 0.3–1.2)
Total Protein: 7.2 g/dL (ref 6.0–8.3)
Total Protein: 7.6 g/dL (ref 6.0–8.3)
Total Protein: 8 g/dL (ref 6.0–8.3)

## 2010-05-15 LAB — BASIC METABOLIC PANEL
BUN: 6 mg/dL (ref 6–23)
BUN: 7 mg/dL (ref 6–23)
CO2: 21 mEq/L (ref 19–32)
Calcium: 8.4 mg/dL (ref 8.4–10.5)
Chloride: 103 mEq/L (ref 96–112)
GFR calc Af Amer: >60 mL/min (ref >60)
Glucose, Bld: 123 mg/dL — ABNORMAL HIGH (ref 70–99)
Potassium: 3.8 mEq/L (ref 3.5–5.1)
Sodium: 133 mEq/L — ABNORMAL LOW (ref 135–145)
Sodium: 137 mEq/L (ref 135–145)

## 2010-05-15 LAB — MAGNESIUM
Magnesium: 1.6 mg/dL (ref 1.5–2.5)
Magnesium: 2.2 mg/dL (ref 1.5–2.5)

## 2010-05-15 LAB — DIFFERENTIAL
Eosinophils Absolute: 0 10*3/uL (ref 0.0–0.7)
Eosinophils Relative: 0 % (ref 0–5)
Lymphs Abs: 1 10*3/uL (ref 0.7–4.0)
Monocytes Relative: 9 % (ref 3–12)

## 2010-05-15 LAB — URINALYSIS, ROUTINE W REFLEX MICROSCOPIC
Bilirubin Urine: NEGATIVE
Glucose, UA: 100 mg/dL — AB
Specific Gravity, Urine: >1.046 — ABNORMAL HIGH (ref 1.005–1.030)
pH: 6 (ref 5.0–8.0)

## 2010-05-15 LAB — CARDIAC PANEL(CRET KIN+CKTOT+MB+TROPI): Relative Index: 0.3 (ref 0.0–2.5)

## 2010-05-15 LAB — HEPATIC FUNCTION PANEL
AST: 160 U/L — ABNORMAL HIGH (ref 0–37)
Bilirubin, Direct: 0.2 mg/dL (ref 0.0–0.3)
Indirect Bilirubin: 0.2 mg/dL — ABNORMAL LOW (ref 0.3–0.9)
Total Bilirubin: 0.4 mg/dL (ref 0.3–1.2)

## 2010-05-15 LAB — ACETAMINOPHEN LEVEL: Acetaminophen (Tylenol), Serum: <10 ug/mL — ABNORMAL LOW (ref 10–30)

## 2010-05-15 LAB — RAPID URINE DRUG SCREEN, HOSP PERFORMED
Amphetamines: NOT DETECTED
Barbiturates: NOT DETECTED

## 2010-05-15 LAB — POCT I-STAT, CHEM 8
Calcium, Ion: 1.05 mmol/L — ABNORMAL LOW (ref 1.12–1.32)
Glucose, Bld: 128 mg/dL — ABNORMAL HIGH (ref 70–99)
HCT: 47 % (ref 39.0–52.0)
Hemoglobin: 16 g/dL (ref 13.0–17.0)
Potassium: 3.4 mEq/L — ABNORMAL LOW (ref 3.5–5.1)

## 2010-05-15 LAB — PROTIME-INR
Prothrombin Time: 12.8 seconds (ref 11.6–15.2)
Prothrombin Time: 13.1 seconds (ref 11.6–15.2)

## 2010-05-15 LAB — TYPE AND SCREEN
ABO/RH(D): O POS
Antibody Screen: NEGATIVE

## 2010-05-15 LAB — SURGICAL PCR SCREEN: MRSA, PCR: NEGATIVE

## 2010-05-15 LAB — T-HELPER CELL (CD4) - (RCID CLINIC ONLY): CD4 T Cell Abs: 410 uL (ref 400–2700)

## 2010-05-15 LAB — URINE MICROSCOPIC-ADD ON

## 2010-05-15 LAB — ETHANOL: Alcohol, Ethyl (B): 91 mg/dL — ABNORMAL HIGH (ref 0–10)

## 2010-05-15 LAB — HCV RNA QUANT
HCV Quantitative Log: 6.2 {Log} — ABNORMAL HIGH (ref <1.63)
HCV Quantitative: 1580000 IU/mL — ABNORMAL HIGH (ref <43)

## 2010-05-18 LAB — T-HELPER CELL (CD4) - (RCID CLINIC ONLY)
CD4 % Helper T Cell: 24 % — ABNORMAL LOW (ref 33–55)
CD4 T Cell Abs: 220 uL — ABNORMAL LOW (ref 400–2700)

## 2010-05-21 ENCOUNTER — Other Ambulatory Visit: Payer: Self-pay | Admitting: *Deleted

## 2010-05-21 ENCOUNTER — Encounter: Payer: Self-pay | Admitting: Adult Health

## 2010-05-21 ENCOUNTER — Ambulatory Visit: Payer: Medicaid Other | Admitting: Adult Health

## 2010-05-21 ENCOUNTER — Other Ambulatory Visit (INDEPENDENT_AMBULATORY_CARE_PROVIDER_SITE_OTHER): Payer: Medicaid Other

## 2010-05-21 ENCOUNTER — Other Ambulatory Visit: Payer: Self-pay | Admitting: Adult Health

## 2010-05-21 DIAGNOSIS — L259 Unspecified contact dermatitis, unspecified cause: Secondary | ICD-10-CM | POA: Insufficient documentation

## 2010-05-21 DIAGNOSIS — F411 Generalized anxiety disorder: Secondary | ICD-10-CM | POA: Insufficient documentation

## 2010-05-21 DIAGNOSIS — R52 Pain, unspecified: Secondary | ICD-10-CM

## 2010-05-21 LAB — BASIC METABOLIC PANEL
BUN: 15 mg/dL (ref 6–23)
Calcium: 7.9 mg/dL — ABNORMAL LOW (ref 8.4–10.5)
Calcium: 9.2 mg/dL (ref 8.4–10.5)
GFR calc Af Amer: >60 mL/min (ref >60)
GFR calc non Af Amer: >60 mL/min (ref >60)
GFR calc non Af Amer: >60 mL/min (ref >60)
Glucose, Bld: 96 mg/dL (ref 70–99)
Sodium: 131 mEq/L — ABNORMAL LOW (ref 135–145)

## 2010-05-21 LAB — CBC
HCT: 32.8 % — ABNORMAL LOW (ref 39.0–52.0)
Hemoglobin: 11.6 g/dL — ABNORMAL LOW (ref 13.0–17.0)
Hemoglobin: 12.3 g/dL — ABNORMAL LOW (ref 13.0–17.0)
MCHC: 33.9 g/dL (ref 30.0–36.0)
MCHC: 34.4 g/dL (ref 30.0–36.0)
MCV: 98.5 fL (ref 78.0–100.0)
MCV: 98.7 fL (ref 78.0–100.0)
Platelets: 206 10*3/uL (ref 150–400)
RBC: 3.33 MIL/uL — ABNORMAL LOW (ref 4.22–5.81)
RBC: 3.44 MIL/uL — ABNORMAL LOW (ref 4.22–5.81)
RBC: 3.65 MIL/uL — ABNORMAL LOW (ref 4.22–5.81)
RDW: 14.1 % (ref 11.5–15.5)
RDW: 14.9 % (ref 11.5–15.5)

## 2010-05-21 NOTE — Telephone Encounter (Signed)
Last filled 2/20 but... On 1/20 a drug screen was done and the comment attached stated this needed to be discussed at an appt, please review

## 2010-05-21 NOTE — Telephone Encounter (Signed)
Spoke w/ dr Phillips Odor will do uds and make appt. Pt request script today, explained he must be seen, uds done sent to lab

## 2010-05-21 NOTE — Telephone Encounter (Signed)
Pt states he last took percocet Friday 3/ 16

## 2010-05-22 ENCOUNTER — Encounter: Payer: Self-pay | Admitting: Internal Medicine

## 2010-05-22 ENCOUNTER — Ambulatory Visit (INDEPENDENT_AMBULATORY_CARE_PROVIDER_SITE_OTHER): Payer: Medicaid Other | Admitting: Internal Medicine

## 2010-05-22 DIAGNOSIS — B20 Human immunodeficiency virus [HIV] disease: Secondary | ICD-10-CM

## 2010-05-22 DIAGNOSIS — C329 Malignant neoplasm of larynx, unspecified: Secondary | ICD-10-CM

## 2010-05-22 LAB — CBC
HCT: 45.2 % (ref 39.0–52.0)
Hemoglobin: 15.4 g/dL (ref 13.0–17.0)
MCV: 95.5 fL (ref 78.0–100.0)
Platelets: 200 10*3/uL (ref 150–400)
RDW: 15.7 % — ABNORMAL HIGH (ref 11.5–15.5)

## 2010-05-22 LAB — BASIC METABOLIC PANEL
BUN: 14 mg/dL (ref 6–23)
CO2: 26 mEq/L (ref 19–32)
Chloride: 102 mEq/L (ref 96–112)
Glucose, Bld: 84 mg/dL (ref 70–99)
Potassium: 4.2 mEq/L (ref 3.5–5.1)

## 2010-05-22 LAB — DRUGS OF ABUSE SCREEN W/O ALC, ROUTINE URINE
Barbiturate Quant, Ur: NEGATIVE
Benzodiazepines.: POSITIVE — AB
Marijuana Metabolite: NEGATIVE
Methadone: NEGATIVE
Propoxyphene: NEGATIVE

## 2010-05-22 LAB — HEPATIC FUNCTION PANEL
Bilirubin, Direct: 0.1 mg/dL (ref 0.0–0.3)
Indirect Bilirubin: 0.4 mg/dL (ref 0.3–0.9)
Total Bilirubin: 0.5 mg/dL (ref 0.3–1.2)

## 2010-05-22 LAB — APTT: aPTT: 29 seconds (ref 24–37)

## 2010-05-22 LAB — PROTIME-INR: INR: 0.96 (ref 0.00–1.49)

## 2010-05-22 MED ORDER — OXYCODONE-ACETAMINOPHEN 5-325 MG PO TABS: 1.0000 | ORAL_TABLET | Freq: Three times a day (TID) | ORAL | Status: DC | PRN

## 2010-05-22 NOTE — Patient Instructions (Signed)
Make a follow up appointment in 3 months. Take all medication as directed. 

## 2010-05-22 NOTE — Telephone Encounter (Signed)
High risk patient. Negative uds in past. Needs pain evaluation.
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
## 2010-05-23 ENCOUNTER — Encounter: Payer: Self-pay | Admitting: Internal Medicine

## 2010-05-23 DIAGNOSIS — C329 Malignant neoplasm of larynx, unspecified: Secondary | ICD-10-CM | POA: Insufficient documentation

## 2010-05-23 LAB — CBC
HCT: 50.5 % (ref 39.0–52.0)
MCV: 92.2 fL (ref 78.0–100.0)
Platelets: 193 10*3/uL (ref 150–400)
RDW: 12.7 % (ref 11.5–15.5)
WBC: 12.4 10*3/uL — ABNORMAL HIGH (ref 4.0–10.5)

## 2010-05-23 LAB — HEPATIC FUNCTION PANEL
ALT: 24 U/L (ref 0–53)
Bilirubin, Direct: 0.3 mg/dL (ref 0.0–0.3)
Indirect Bilirubin: 0.2 mg/dL — ABNORMAL LOW (ref 0.3–0.9)
Total Protein: 8.9 g/dL — ABNORMAL HIGH (ref 6.0–8.3)

## 2010-05-23 LAB — BASIC METABOLIC PANEL
BUN: 8 mg/dL (ref 6–23)
Chloride: 102 mEq/L (ref 96–112)
Creatinine, Ser: 0.97 mg/dL (ref 0.4–1.5)
GFR calc non Af Amer: >60 mL/min (ref >60)
Glucose, Bld: 90 mg/dL (ref 70–99)

## 2010-05-23 NOTE — Assessment & Plan Note (Addendum)
Patient refused chemo and radiation. Opted for surgical management, was done by Dr. Pollyann Kennedy. Continue current pain regimen.

## 2010-05-23 NOTE — Assessment & Plan Note (Signed)
Per ID 

## 2010-05-24 LAB — BENZODIAZEPINE, QUANTITATIVE, URINE
Alprazolam (GC/LC/MS), ur confirm: 340 NG/ML — ABNORMAL HIGH
Nordiazepam GC/MS Conf: NEGATIVE NG/ML
Oxazepam GC/MS Conf: NEGATIVE NG/ML
Temazapam: NEGATIVE NG/ML

## 2010-05-24 LAB — OPIATE, QUANTITATIVE, URINE
Morphine Urine: NEGATIVE NG/ML
Oxymorphone: NEGATIVE NG/ML

## 2010-05-24 LAB — HEPATITIS C RNA QUANTITATIVE: HCV Quantitative Log: 6.35 {Log} — ABNORMAL HIGH (ref <1.63)

## 2010-05-24 NOTE — Telephone Encounter (Signed)
closed

## 2010-05-25 LAB — HEPATITIS C GENOTYPE

## 2010-05-31 NOTE — Assessment & Plan Note (Signed)
Summary: F/U ON LABS/VS   Vital Signs:  Patient profile:   60 year old male Height:      63 inches Weight:      148.5 pounds BMI:     26.40 Temp:     97.3 degrees F oral Pulse rate:   90 / minute BP sitting:   136 / 83  (right arm)  Vitals Entered By: Alesia Morin CMA (May 21, 2010 10:45 AM) CC: follow-up visit for labs, pt also wants to know why he got generic alprazolam (xanax) and why his dose changed. Is Patient Diabetic? No Pain Assessment Patient in pain? no      Nutritional Status BMI of 25 - 29 = overweight Nutritional Status Detail appetite "good"  Have you ever been in a relationship where you felt threatened, hurt or afraid?No   Does patient need assistance? Functional Status Self care Ambulation Normal Comments no missed doses of meds   CC:  follow-up visit for labs and pt also wants to know why he got generic alprazolam (xanax) and why his dose changed.Marland Kitchen  History of Present Illness: In for f/u after lab draw.  Essentially doing well.  Wants to know why his Rx for alprazolam was not written as Brand name and only for 60 tabs, not 90 tabs.  Also c/o itching rash on right thumb and inter webbing of right thumb and index finger. Adherent with HIV meds with good toklerance.  Preventive Screening-Counseling & Management  Alcohol-Tobacco     Alcohol drinks/day: 2     Alcohol type: beer     Alcohol Counseling: to decrease amount and/or frequency of alcohol intake     Smoking Status: quit > 6 months     Packs/Day: 1.0     Pack years: 45 years     Cigars/week: cigars     Passive Smoke Exposure: No  Caffeine-Diet-Exercise     Caffeine use/day: coffee 5 per day     Does Patient Exercise: ROM  Safety-Violence-Falls     Seat Belt Use: yes      Sexual History:  no.        Drug Use:  former and yes.        Blood Transfusions:  no.        Travel History:  no.    Comments: pt declined condoms  Allergies (verified): No Known Drug Allergies  Social  History: Sexual History:  no Drug Use:  former, yes Blood Transfusions:  no Travel History:  no  Review of Systems       The patient complains of suspicious skin lesions.  The patient denies anorexia, fever, weight loss, weight gain, vision loss, decreased hearing, chest pain, syncope, dyspnea on exertion, peripheral edema, prolonged cough, headaches, hemoptysis, abdominal pain, melena, hematochezia, severe indigestion/heartburn, hematuria, incontinence, genital sores, muscle weakness, transient blindness, difficulty walking, depression, unusual weight change, abnormal bleeding, enlarged lymph nodes, angioedema, and testicular masses.    Physical Exam  General:  alert, well-developed, well-nourished, and well-hydrated.   Head:  normocephalic and atraumatic.   Eyes:  vision grossly intact, pupils equal, pupils round, and pupils reactive to light.   Ears:  R ear normal and L ear normal.   Nose:  no external deformity and no nasal discharge.   Mouth:  good dentition and pharynx pink and moist.   Neck:  supple, full ROM, and no masses.   Lungs:  normal respiratory effort and normal breath sounds.   Heart:  normal rate, regular rhythm, no  murmur, no gallop, and no rub.   Abdomen:  soft, non-tender, and normal bowel sounds.   Msk:  No deformity or scoliosis noted of thoracic or lumbar spine.   Extremities:  No clubbing, cyanosis, edema, or deformity noted with normal full range of motion of all joints.   Neurologic:  alert & oriented X3, cranial nerves II-XII intact, strength normal in all extremities, and gait normal.   Skin:  Seborrheic patches to dorsal surface of right thumb. Psych:  Cognition and judgment appear intact. Alert and cooperative with normal attention span and concentration. No apparent delusions, illusions, hallucinations   Impression & Recommendations:  Problem # 1:  HIV INFECTION (ICD-042) CD4 400 @ 28% with VL 31 copies.  Clinically stable.  CPM.  Repeat labs in 10  weeks with f/u in 3 months. Future Orders: T-CBC w/Diff [id number]) ... 07/30/2010 T-CD4SP ([facility]) (CD4SP) ... 07/30/2010 T-Comprehensive Metabolic Panel [id number]) ... 07/30/2010 T-HIV Viral Load [id number]) ... 07/30/2010  Problem # 2:  CONTACT DERMATITIS&OTHER ECZEMA DUE UNSPEC CAUSE (ICD-692.9) Ketoconazole 2% creamn with hydrcortisone 1% cream to affected areas two times a day  His updated medication list for this problem includes:    Hydrocortisone 1 % Crea (Hydrocortisone) .Marland Kitchen... Apply sparingly to affected area two times a day as directed  Problem # 3:  ANXIETY, CHRONIC (ICD-300.00) Will approve for 30 additional Xanax tablets at time they would be due (approx 04/07). His updated medication list for this problem includes:    Alprazolam 1 Mg Tabs (Alprazolam) .Marland Kitchen... Take 1 tablet by mouth every 8 hours as needed    Xanax 1 Mg Tabs (Alprazolam) .Marland Kitchen... 1 tablet by mouth q8h prn  Medications Added to Medication List This Visit: 1)  Hydrocortisone 1 % Crea (Hydrocortisone) .... Apply sparingly to affected area two times a day as directed 2)  Ketoconazole 2 % Crea (Ketoconazole) .... Apply sparingly to affected area two times a day as directed 3)  Xanax 1 Mg Tabs (Alprazolam) .Marland Kitchen.. 1 tablet by mouth q8h prn  Other Orders: T-Hepatitis C Viral Load [id number]) T-Hepattis C Genotype, DNA ([id number]) Ultrasound (Ultrasound) Prescriptions: HYDROCORTISONE 1 % CREA (HYDROCORTISONE) Apply sparingly to affected area two times a day as directed  #15 g x 1   Entered and Authorized by:   Talmadge Chad NP   Signed by:   Talmadge Chad NP on 05/21/2010   Method used:   Print then Give to Patient   RxID:   [id number] KETOCONAZOLE 2 % CREA (KETOCONAZOLE) Apply sparingly to affected area two times a day as directed  #15 g x 1   Entered and Authorized by:   Talmadge Chad NP   Signed by:   Talmadge Chad NP on 05/21/2010   Method used:   Print then Give to  Patient   RxID:   [id number]    Orders Added: 1)  T-Hepatitis C Viral Load [[id number]] 2)  T-Hepattis C Genotype, DNA [[id number]] 3)  T-CBC w/Diff [[id number]] 4)  T-CD4SP ([facility]) [CD4SP] 5)  T-Comprehensive Metabolic Panel [[id number]] 6)  T-HIV Viral Load [id number] 7)  Ultrasound [Ultrasound]

## 2010-05-31 NOTE — Telephone Encounter (Signed)
done

## 2010-06-11 ENCOUNTER — Ambulatory Visit: Payer: Medicaid Other | Admitting: Adult Health

## 2010-06-13 ENCOUNTER — Ambulatory Visit (INDEPENDENT_AMBULATORY_CARE_PROVIDER_SITE_OTHER): Payer: Medicaid Other | Admitting: Adult Health

## 2010-06-13 DIAGNOSIS — B1710 Acute hepatitis C without hepatic coma: Secondary | ICD-10-CM

## 2010-06-13 DIAGNOSIS — F411 Generalized anxiety disorder: Secondary | ICD-10-CM

## 2010-06-13 DIAGNOSIS — B20 Human immunodeficiency virus [HIV] disease: Secondary | ICD-10-CM

## 2010-06-13 LAB — URINALYSIS, ROUTINE W REFLEX MICROSCOPIC
Glucose, UA: NEGATIVE mg/dL
Ketones, ur: NEGATIVE mg/dL
Nitrite: NEGATIVE
pH: 6 (ref 5.0–8.0)

## 2010-06-13 LAB — GC/CHLAMYDIA PROBE AMP, GENITAL: GC Probe Amp, Genital: NEGATIVE

## 2010-06-19 ENCOUNTER — Other Ambulatory Visit: Payer: Self-pay | Admitting: *Deleted

## 2010-06-19 MED ORDER — OXYCODONE-ACETAMINOPHEN 5-325 MG PO TABS: 1.0000 | ORAL_TABLET | Freq: Three times a day (TID) | ORAL | Status: DC | PRN

## 2010-06-19 MED ORDER — ALPRAZOLAM 1 MG PO TABS: 1.0000 mg | ORAL_TABLET | Freq: Three times a day (TID) | ORAL | Status: DC | PRN

## 2010-06-19 NOTE — Telephone Encounter (Signed)
Pt last UDS negative for opiates as he states he ran out of Percocet 1 week prior to clinic appt. Pt will need to come in for UDS.

## 2010-06-21 ENCOUNTER — Other Ambulatory Visit: Payer: Medicaid Other

## 2010-06-21 ENCOUNTER — Other Ambulatory Visit: Payer: Self-pay | Admitting: Internal Medicine

## 2010-06-21 DIAGNOSIS — G8929 Other chronic pain: Secondary | ICD-10-CM

## 2010-06-21 MED ORDER — OXYCODONE-ACETAMINOPHEN 5-325 MG PO TABS: 1.0000 | ORAL_TABLET | Freq: Three times a day (TID) | ORAL | Status: DC | PRN

## 2010-06-21 NOTE — Telephone Encounter (Signed)
Reprinted scripts for patient.

## 2010-06-22 LAB — DRUGS OF ABUSE SCREEN W/O ALC, ROUTINE URINE
Benzodiazepines.: POSITIVE — AB
Creatinine,U: 244.5 mg/dL
Marijuana Metabolite: NEGATIVE
Methadone: NEGATIVE
Opiate Screen, Urine: NEGATIVE
Propoxyphene: NEGATIVE

## 2010-06-26 LAB — BENZODIAZEPINE, QUANTITATIVE, URINE
Lorazepam: NEGATIVE NG/ML
Oxazepam GC/MS Conf: NEGATIVE NG/ML

## 2010-07-10 ENCOUNTER — Telehealth: Payer: Self-pay | Admitting: *Deleted

## 2010-07-10 NOTE — Telephone Encounter (Signed)
George Allen, George Allen received a 30-day supply of his Xanax as per what was his routine.  I suggest if he feels he needs more, he should contact his PCP for an evaluation and adjustment with his meds.

## 2010-07-10 NOTE — Telephone Encounter (Signed)
States he wants a refill of his xanax. Told him last fill was on 4/17 & was good for 30 days. Told him I will check with provider about early refill & will call him with the response. [phone]

## 2010-07-11 ENCOUNTER — Other Ambulatory Visit: Payer: Self-pay | Admitting: *Deleted

## 2010-07-11 MED ORDER — ALPRAZOLAM 1 MG PO TABS: 1.0000 mg | ORAL_TABLET | Freq: Every evening | ORAL | Status: DC | PRN

## 2010-07-11 NOTE — Telephone Encounter (Signed)
Alprazolam rx called to [facility] pharmacy.

## 2010-07-11 NOTE — Telephone Encounter (Signed)
Pt has an appt 08/16/10.

## 2010-07-11 NOTE — Telephone Encounter (Signed)
Pt called this office requesting refill for his Xanax rx.  This [facility] was [facility].  It will be routed to [facility] triage RN.  Jennet Maduro, RN

## 2010-07-12 ENCOUNTER — Other Ambulatory Visit: Payer: Self-pay | Admitting: *Deleted

## 2010-07-12 ENCOUNTER — Telehealth: Payer: Self-pay | Admitting: *Deleted

## 2010-07-12 DIAGNOSIS — F419 Anxiety disorder, unspecified: Secondary | ICD-10-CM

## 2010-07-12 MED ORDER — ALPRAZOLAM 1 MG PO TABS: 1.0000 mg | ORAL_TABLET | Freq: Three times a day (TID) | ORAL | Status: DC | PRN

## 2010-07-12 NOTE — Telephone Encounter (Signed)
Pt stated he lives in [facility] and will be at a friend's house until this am who can take him to pick up his Xanax.  He also stated generic will be okay until his next refill b/c I told him for the brand name, I will need the actual rx w/DAW and Medically nec. Written on it.

## 2010-07-12 NOTE — Telephone Encounter (Signed)
Lm asking pt to call back. Need to tell him that he must see pcp for this med refill

## 2010-07-12 NOTE — Telephone Encounter (Signed)
Xanax rx called in to [facility].

## 2010-07-12 NOTE — Telephone Encounter (Signed)
rec'd a note by fax from [facility], requesting a script stating namebrand, i called [facility] after it was noted that pt had gotten a script from [facility] for #90 the pharmacist states the script from dr sidhu was cancelled by pharm after script from [facility], pt stated he signed a med contract at [facility] and they will fill his xanax from now on.

## 2010-07-13 ENCOUNTER — Other Ambulatory Visit: Payer: Self-pay | Admitting: *Deleted

## 2010-07-13 NOTE — Telephone Encounter (Signed)
Pt will be getting scripts for xanax and pain meds from [facility], unless otherwise informed.

## 2010-07-15 ENCOUNTER — Encounter: Payer: Self-pay | Admitting: Internal Medicine

## 2010-07-17 ENCOUNTER — Other Ambulatory Visit: Payer: Self-pay | Admitting: *Deleted

## 2010-07-17 NOTE — Telephone Encounter (Signed)
Pt states that [facility] told him that internal medicine was to fill his pain med, that they would only fill the xanax. Please advise

## 2010-07-18 MED ORDER — OXYCODONE-ACETAMINOPHEN 5-325 MG PO TABS: 1.0000 | ORAL_TABLET | Freq: Three times a day (TID) | ORAL | Status: DC | PRN

## 2010-08-10 ENCOUNTER — Other Ambulatory Visit: Payer: Self-pay | Admitting: *Deleted

## 2010-08-10 DIAGNOSIS — F419 Anxiety disorder, unspecified: Secondary | ICD-10-CM

## 2010-08-10 MED ORDER — ALPRAZOLAM 1 MG PO TABS: 1.0000 mg | ORAL_TABLET | Freq: Three times a day (TID) | ORAL | Status: DC | PRN

## 2010-08-16 ENCOUNTER — Encounter: Payer: Medicaid Other | Admitting: Internal Medicine

## 2010-08-17 ENCOUNTER — Other Ambulatory Visit: Payer: Self-pay | Admitting: *Deleted

## 2010-08-20 MED ORDER — OXYCODONE-ACETAMINOPHEN 5-325 MG PO TABS: 1.0000 | ORAL_TABLET | Freq: Three times a day (TID) | ORAL | Status: DC | PRN

## 2010-08-22 ENCOUNTER — Other Ambulatory Visit: Payer: Self-pay | Admitting: *Deleted

## 2010-08-22 ENCOUNTER — Telehealth: Payer: Self-pay | Admitting: *Deleted

## 2010-08-22 NOTE — Telephone Encounter (Signed)
Called [facility] pharmacy about his Xanax. There was the original on the sign out list. I spoke with pharmacist & they want the original. It does state brand medically necessary. He took the rx & I changed date on sign sheet to reflect he got it today

## 2010-08-22 NOTE — Telephone Encounter (Signed)
Pharmacist told that we do not have any reason noted to justify brand name xanax. She stated that he just picked up a rx he had from before that had a refill on it. States the one we wrote today will be generic when filled. States she will notify him

## 2010-09-12 ENCOUNTER — Other Ambulatory Visit: Payer: Medicaid Other

## 2010-09-17 ENCOUNTER — Other Ambulatory Visit: Payer: Self-pay | Admitting: *Deleted

## 2010-09-19 ENCOUNTER — Other Ambulatory Visit: Payer: Medicaid Other

## 2010-09-19 DIAGNOSIS — C329 Malignant neoplasm of larynx, unspecified: Secondary | ICD-10-CM

## 2010-09-19 DIAGNOSIS — C140 Malignant neoplasm of pharynx, unspecified: Secondary | ICD-10-CM

## 2010-09-19 MED ORDER — OXYCODONE-ACETAMINOPHEN 5-325 MG PO TABS: 1.0000 | ORAL_TABLET | Freq: Three times a day (TID) | ORAL | Status: DC | PRN

## 2010-09-19 NOTE — Telephone Encounter (Signed)
Will refill and check UDS today

## 2010-09-20 LAB — DRUGS OF ABUSE SCREEN W/O ALC, ROUTINE URINE
Amphetamine Screen, Ur: NEGATIVE
Barbiturate Quant, Ur: NEGATIVE
Benzodiazepines.: NEGATIVE
Marijuana Metabolite: NEGATIVE
Methadone: NEGATIVE
Opiate Screen, Urine: NEGATIVE
Propoxyphene: NEGATIVE

## 2010-09-24 LAB — OPIATE, QUANTITATIVE, URINE
Codeine Urine: NEGATIVE NG/ML
Morphine Urine: NEGATIVE NG/ML

## 2010-09-26 ENCOUNTER — Ambulatory Visit: Payer: Medicaid Other | Admitting: Adult Health

## 2010-09-27 ENCOUNTER — Encounter: Payer: Medicaid Other | Admitting: Internal Medicine

## 2010-10-03 ENCOUNTER — Encounter: Payer: Medicaid Other | Admitting: Internal Medicine

## 2010-10-10 ENCOUNTER — Other Ambulatory Visit (INDEPENDENT_AMBULATORY_CARE_PROVIDER_SITE_OTHER): Payer: Medicaid Other

## 2010-10-10 ENCOUNTER — Encounter: Payer: Self-pay | Admitting: Internal Medicine

## 2010-10-10 ENCOUNTER — Other Ambulatory Visit: Payer: Self-pay | Admitting: *Deleted

## 2010-10-10 ENCOUNTER — Ambulatory Visit (INDEPENDENT_AMBULATORY_CARE_PROVIDER_SITE_OTHER): Payer: Medicaid Other | Admitting: Internal Medicine

## 2010-10-10 ENCOUNTER — Other Ambulatory Visit: Payer: Self-pay

## 2010-10-10 VITALS — BP 144/87 | HR 60 | Temp 96.7°F | Ht 63.0 in | Wt 144.8 lb

## 2010-10-10 DIAGNOSIS — Z21 Asymptomatic human immunodeficiency virus [HIV] infection status: Secondary | ICD-10-CM

## 2010-10-10 DIAGNOSIS — Z79899 Other long term (current) drug therapy: Secondary | ICD-10-CM

## 2010-10-10 DIAGNOSIS — Z113 Encounter for screening for infections with a predominantly sexual mode of transmission: Secondary | ICD-10-CM

## 2010-10-10 DIAGNOSIS — B20 Human immunodeficiency virus [HIV] disease: Secondary | ICD-10-CM

## 2010-10-10 DIAGNOSIS — F419 Anxiety disorder, unspecified: Secondary | ICD-10-CM

## 2010-10-10 DIAGNOSIS — C329 Malignant neoplasm of larynx, unspecified: Secondary | ICD-10-CM

## 2010-10-10 LAB — COMPLETE METABOLIC PANEL WITH GFR
ALT: 94 U/L — ABNORMAL HIGH (ref 0–53)
Albumin: 4 g/dL (ref 3.5–5.2)
CO2: 22 mEq/L (ref 19–32)
Calcium: 8.9 mg/dL (ref 8.4–10.5)
Chloride: 103 mEq/L (ref 96–112)
GFR, Est African American: >60 mL/min (ref >60)
Glucose, Bld: 91 mg/dL (ref 70–99)
Potassium: 3.9 mEq/L (ref 3.5–5.3)
Sodium: 136 mEq/L (ref 135–145)
Total Protein: 7.9 g/dL (ref 6.0–8.3)

## 2010-10-10 LAB — URINALYSIS, ROUTINE W REFLEX MICROSCOPIC
Glucose, UA: NEGATIVE mg/dL
Hgb urine dipstick: NEGATIVE
Leukocytes, UA: NEGATIVE
pH: 5.5 (ref 5.0–8.0)

## 2010-10-10 LAB — LIPID PANEL
Cholesterol: 199 mg/dL (ref 0–200)
Total CHOL/HDL Ratio: 2.4 Ratio
Triglycerides: 75 mg/dL (ref <150)

## 2010-10-10 LAB — URINALYSIS, MICROSCOPIC ONLY: Squamous Epithelial / LPF: NONE SEEN

## 2010-10-10 LAB — CBC WITH DIFFERENTIAL/PLATELET
Eosinophils Absolute: 0.1 10*3/uL (ref 0.0–0.7)
Eosinophils Relative: 2 % (ref 0–5)
HCT: 43.5 % (ref 39.0–52.0)
Hemoglobin: 15.6 g/dL (ref 13.0–17.0)
Lymphs Abs: 1.1 10*3/uL (ref 0.7–4.0)
MCH: 34 pg (ref 26.0–34.0)
MCHC: 35.9 g/dL (ref 30.0–36.0)
MCV: 94.8 fL (ref 78.0–100.0)
Monocytes Absolute: 0.4 10*3/uL (ref 0.1–1.0)
Monocytes Relative: 7 % (ref 3–12)
RBC: 4.59 MIL/uL (ref 4.22–5.81)

## 2010-10-10 LAB — RPR

## 2010-10-10 MED ORDER — GABAPENTIN 300 MG PO CAPS: 300.0000 mg | ORAL_CAPSULE | Freq: Three times a day (TID) | ORAL | Status: DC

## 2010-10-10 MED ORDER — ALPRAZOLAM 1 MG PO TABS: 1.0000 mg | ORAL_TABLET | Freq: Three times a day (TID) | ORAL | Status: DC | PRN

## 2010-10-10 NOTE — Assessment & Plan Note (Signed)
1. Pain control - as this pain seems more neuropathic in origin we will prescribe gabapentin 300 mg TID, with once daily for 5 days then BID for 5 days, then TID. We did not refill his percocet at this visit as that would be in violation of his pain contract. We did tell him that we will refill his prescription on Oct 20 2010 when it is due and as this is a Saturday we will make it available for him to pick up at the front desk in clinic on the 17th. He was informed that this will be the last percocet he receives until he sees Dr. Baltazar Apo. He did state that his last usage of percocet was Saturday (4 days prior to visit). We did get a UDS on this visit. He also states that he is using Xanax TID. He was given a copy of his pain contract today and informed that he would not be able to get his refill until the 17th and that he would not receive any more percocet until he sees Dr. Baltazar Apo. He was also advised that if he was unable to make an appointment it was better to call and re-schedule rather than just not showing up. Looking in epic I noted that he has missed several ID visits recently as well. Dr. Josem Kaufmann and I did run a database search for narcotic prescription filled by the pt and the last record of him picking up percocet was in March of this year however we do have the last prescription written by Dr. Baltazar Apo scanned into the computer from September 19 2010.

## 2010-10-10 NOTE — Patient Instructions (Signed)
You were seen today for a walk in visit for pain. We did prescribe Gabapentin 300 mg to help with your pain. Please take 1 pill at bedtime for 5 days then take 1 pill at breakfast and bedtime for 5 days then take 1 pill three times a day. We are not able to re-fill your percocet today. If you stop by the clinic we will re-fill it for you when it is due. You can stop in and pick up the prescription on October 19, 2010. After this you will not be given any refills until your see your regular doctor, Dr. Baltazar Apo. You were given a urine test today and were given a copy of your pain contract today.

## 2010-10-10 NOTE — Progress Notes (Signed)
Subjective:    Patient ID: George Allen, male    DOB: May 29, 1950, 60 y.o.   MRN: [id number]  HPI Patient is a 60 year old male that is coming today for an acute visit for pain medication. He did sign a pain contract in December of 2011that does state he is entitled to 18 Percocet per month and it will not be refilled early. His contract also states that he is supposed to provide a urine sample when requested. He has had suspicious UDS in the past that was negative while he was supposedly taking the medication. He was supposed to see his primary doctor Dr. Baltazar Apo on August 1. He was not informed of this visit and therefore did not show. He was also scheduled on July 27 when he did not show. He was with his mother elsewhere in the state. She was in a nursing home. He did get a refill of his percocet last on September 19, 2010. He does describe the pain as more burning in origin in his left shoulder radiating down into the left arm. He states it did happen after he had his surgical resection of his laryngeal carcinoma. He has had pain since then. He feels the Percocet is wearing off too soon, and he would like to get either a stronger dosage or more Percocet. He does not have any other complaints at this time.    Review of Systems  Constitutional: Negative for fever, chills, diaphoresis, activity change, appetite change, fatigue and unexpected weight change.  HENT: Positive for neck pain, neck stiffness and voice change. Negative for sore throat, facial swelling and trouble swallowing.        Pt did have resection of laryngeal carcinoma and now speaks with an assist device.   Eyes: Negative.   Respiratory: Negative for cough, choking, chest tightness and shortness of breath.   Cardiovascular: Negative for chest pain, palpitations and leg swelling.  Gastrointestinal: Negative for nausea, vomiting, abdominal pain, diarrhea, constipation and abdominal distention.  Musculoskeletal: Positive for myalgias, back  pain and arthralgias. Negative for joint swelling and gait problem.  Skin: Negative.   Neurological: Negative.   Hematological: Negative.   Psychiatric/Behavioral: Negative.        Objective:   Physical Exam  Constitutional: He is oriented to person, place, and time. He appears well-developed and well-nourished.  HENT:  Head: Normocephalic and atraumatic.  Eyes: EOM are normal. Pupils are equal, round, and reactive to light.  Neck: No JVD present. No tracheal deviation present.       Pt is s/p resection of laryngeal carcinoma. He does speak with assist device.  Cardiovascular: Normal rate, regular rhythm and normal heart sounds.   Pulmonary/Chest: Effort normal and breath sounds normal. No stridor.  Abdominal: Soft. Bowel sounds are normal.  Musculoskeletal: He exhibits tenderness. He exhibits no edema.       Pt had tenderness in the L should which he states radiated into the L arm. It did not radiate into his head and not into his chest. Per pt, it has been present since surgery.   Neurological: He is alert and oriented to person, place, and time. No cranial nerve deficit.  Skin: Skin is warm and dry.          Assessment & Plan:  1. Pain control - as this pain seems more neuropathic in origin we will prescribe gabapentin 300 mg TID, with once daily for 5 days then BID for 5 days, then TID. We did not refill his  percocet at this visit as that would be in violation of his pain contract. We did tell him that we will refill his prescription on Oct 20 2010 when it is due and as this is a Saturday we will make it available for him to pick up at the front desk in clinic on the 17th. He was informed that this will be the last percocet he receives until he sees Dr. Baltazar Apo. He did state that his last usage of percocet was Saturday (4 days prior to visit). We did get a UDS on this visit. He also states that he is using Xanax TID. He was given a copy of his pain contract today and informed that he  would not be able to get his refill until the 17th and that he would not receive any more percocet until he sees Dr. Baltazar Apo. He was also advised that if he was unable to make an appointment it was better to call and re-schedule rather than just not showing up. Looking in epic I noted that he has missed several ID visits recently as well. Dr. Josem Kaufmann and I did run a database search for narcotic prescription filled by the pt and the last record of him picking up percocet was in March of this year however we do have the last prescription written by Dr. Baltazar Apo scanned into the computer from September 19 2010.   2. Other medical problems not addressed at this visit - hepatitis C, HIV, laryngeal carcinoma, depression, insomnia, anxiety.  3. disposition-the patient was given a 300 mg gabapentin prescription which he will titrate as described above. He will see Dr. Baltazar Apo when she has an available appointment. He will not receive any further Percocet refills after the refill that we'll be done on August 17th until he sees Dr. Baltazar Apo.

## 2010-10-10 NOTE — Telephone Encounter (Signed)
I spoke with pharmacist at [facility]. He will accept generic. He has medicaid & they will not pay for brand name. I gave the refill to the pharmacist

## 2010-10-11 LAB — T-HELPER CELL (CD4) - (RCID CLINIC ONLY): CD4 T Cell Abs: 330 uL — ABNORMAL LOW (ref 400–2700)

## 2010-10-11 LAB — DRUGS OF ABUSE SCREEN W/O ALC, ROUTINE URINE
Barbiturate Quant, Ur: NEGATIVE
Cocaine Metabolites: NEGATIVE
Creatinine,U: 188 mg/dL
Methadone: NEGATIVE

## 2010-10-11 NOTE — Progress Notes (Signed)
George Allen history and physical examination were reviewed with Dr. Dorise Hiss and I also interviewed George Allen.  His pain is not adequately controlled with 90 percocet per month.  He feels he needs more.  When we discussed his pain there seemed to be a significant neuropathic component, hence the reason for starting the gabapentin.  We reviewed the pain contract with him so he understood how to go about asking for an increase allotment of narcotics and to be careful not to run out too quickly.  We did not refill his narcotics early, but will make sure he can pick up the prescription for his usual percocet on Friday August 17th.  We will not increase the number of percocets until he sees his PCP but we would be willing to refill the current number if he is unable to be seen by Dr. Baltazar Apo before the next prescription would be due.  It is hoped that the pain will be better controlled on the gabapentin and that an increase in the number of percocet will not be required.  Dr. Dorise Hiss and I formulated the assessment and plan together.  I agree with her documentation.

## 2010-10-14 LAB — OPIATE, QUANTITATIVE, URINE
Morphine Urine: NEGATIVE NG/ML
Oxycodone - Total: NEGATIVE NG/ML
Oxymorphone: NEGATIVE NG/ML

## 2010-10-16 ENCOUNTER — Telehealth: Payer: Self-pay | Admitting: *Deleted

## 2010-10-16 NOTE — Telephone Encounter (Signed)
George Allen, of [facility] calls and leaves a message that a woman who accompanies pt on visists to the pharm came in alone and stated that she wanted to let them know that pt is selling his narcotics.

## 2010-10-18 ENCOUNTER — Telehealth: Payer: Self-pay | Admitting: *Deleted

## 2010-10-18 NOTE — Telephone Encounter (Signed)
Pt called this am and wanted his script, i re-inforced the fact that dr Josem Kaufmann had stated clearly to him that his script would be done Friday not today and calling numerous times is a violation. He said ok he would wait til tomorrow. At appr 1442 pt called again and ask if possibly the script was ready and he wanted to pick it up today, i again said it would be tomorrow, he then ask if there was any way today, again i said tomorrow. He then asked what time tomorrow and i said it was totally up to the physician.

## 2010-10-19 ENCOUNTER — Other Ambulatory Visit: Payer: Self-pay | Admitting: Internal Medicine

## 2010-10-19 MED ORDER — OXYCODONE-ACETAMINOPHEN 5-325 MG PO TABS: 1.0000 | ORAL_TABLET | Freq: Three times a day (TID) | ORAL | Status: DC | PRN

## 2010-10-22 NOTE — Telephone Encounter (Signed)
Noted. Patient's script was given 10/19/10 before this documentation was received.

## 2010-10-25 ENCOUNTER — Ambulatory Visit: Payer: Medicaid Other | Admitting: Adult Health

## 2010-11-07 ENCOUNTER — Other Ambulatory Visit: Payer: Self-pay | Admitting: *Deleted

## 2010-11-07 DIAGNOSIS — F419 Anxiety disorder, unspecified: Secondary | ICD-10-CM

## 2010-11-07 MED ORDER — ALPRAZOLAM 1 MG PO TABS: 1.0000 mg | ORAL_TABLET | Freq: Three times a day (TID) | ORAL | Status: DC | PRN

## 2010-11-08 ENCOUNTER — Other Ambulatory Visit: Payer: Self-pay | Admitting: *Deleted

## 2010-11-08 NOTE — Telephone Encounter (Signed)
Prescription previously printed and signed by B. Sundra Aland, NP for pick-up by pt. on Thurs., Sept. 6, 2012.  Jennet Maduro, RN

## 2010-11-12 ENCOUNTER — Other Ambulatory Visit: Payer: Self-pay | Admitting: Adult Health

## 2010-11-12 NOTE — Progress Notes (Signed)
Progress notes from [facility] dated, 10/20/2010, reveals that there have been reports of him selling his controlled substances. The previous 2 urine drug screens were negative for all controlled substances. Therefore, it is clear that he is no longer ingesting these medications. Last pickup of medications, was 11/12/2010. No further alprazolam prescriptions will be provided for the patient given the data provided in his medical record and the notes provided by [facility].

## 2010-11-15 ENCOUNTER — Ambulatory Visit (HOSPITAL_COMMUNITY)
Admission: RE | Admit: 2010-11-15 | Discharge: 2010-11-15 | Disposition: A | Discharge status: Home / Self Care | Payer: Medicaid Other | Source: Ambulatory Visit | Attending: Internal Medicine | Admitting: Internal Medicine

## 2010-11-15 ENCOUNTER — Ambulatory Visit (INDEPENDENT_AMBULATORY_CARE_PROVIDER_SITE_OTHER): Payer: Medicaid Other | Admitting: Internal Medicine

## 2010-11-15 ENCOUNTER — Encounter: Payer: Self-pay | Admitting: Internal Medicine

## 2010-11-15 VITALS — BP 143/87 | HR 72 | Temp 97.9°F | Ht 63.0 in | Wt 142.3 lb

## 2010-11-15 DIAGNOSIS — M25519 Pain in unspecified shoulder: Secondary | ICD-10-CM | POA: Insufficient documentation

## 2010-11-15 DIAGNOSIS — Z23 Encounter for immunization: Secondary | ICD-10-CM

## 2010-11-15 DIAGNOSIS — M25512 Pain in left shoulder: Secondary | ICD-10-CM | POA: Insufficient documentation

## 2010-11-15 MED ORDER — OXYCODONE-ACETAMINOPHEN 5-325 MG PO TABS: 1.0000 | ORAL_TABLET | Freq: Three times a day (TID) | ORAL | Status: DC | PRN
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
## 2010-11-15 NOTE — Patient Instructions (Signed)
Please follow up in November
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
## 2010-11-16 LAB — DRUGS OF ABUSE SCREEN W/O ALC, ROUTINE URINE
Cocaine Metabolites: NEGATIVE
Creatinine,U: 211.1 mg/dL
Opiate Screen, Urine: NEGATIVE
Phencyclidine (PCP): NEGATIVE

## 2010-11-20 LAB — OPIATE, QUANTITATIVE, URINE
Codeine Urine: NEGATIVE NG/ML
Hydrocodone: NEGATIVE NG/ML
Hydromorphone - Total: NEGATIVE NG/ML

## 2010-12-10 ENCOUNTER — Telehealth: Payer: Self-pay | Admitting: *Deleted

## 2010-12-10 NOTE — Telephone Encounter (Signed)
There is a note attached to the chart stating pt was tested for presence of xanax by internal medicine and no traces of medication was found.   Return call made to patient and explained it was not a pharmacy error it was due to the drug screening. He should contact internal medicine for f/u.  Since he was contracted with [facility] we will no longer fill this medication.  Medication will be removed form list.   Laurell Josephs,

## 2010-12-10 NOTE — Telephone Encounter (Signed)
Pt is calling for Xanax refill. There is documentation stating he is no longer to get Xanax filled at this facility. Brad attached note to prescription sign out stating no more refills.   Tomasita Morrow, RN

## 2010-12-11 ENCOUNTER — Other Ambulatory Visit: Payer: Self-pay | Admitting: *Deleted

## 2010-12-11 DIAGNOSIS — B20 Human immunodeficiency virus [HIV] disease: Secondary | ICD-10-CM

## 2010-12-11 MED ORDER — EFAVIRENZ-EMTRICITAB-TENOFOVIR 600-200-300 MG PO TABS: 1.0000 | ORAL_TABLET | Freq: Every day | ORAL | Status: DC

## 2010-12-13 ENCOUNTER — Other Ambulatory Visit: Payer: Self-pay | Admitting: *Deleted

## 2010-12-13 NOTE — Telephone Encounter (Signed)
He called to get a refill . Told him per notes by provider, we are no longer going to rx narcotics or xanax. I advised him to find a pcp & discuss with him/her

## 2010-12-20 ENCOUNTER — Other Ambulatory Visit: Payer: Self-pay | Admitting: *Deleted

## 2010-12-21 ENCOUNTER — Telehealth: Payer: Self-pay | Admitting: *Deleted

## 2010-12-21 NOTE — Telephone Encounter (Signed)
Thank you for dealing with this.

## 2010-12-21 NOTE — Telephone Encounter (Signed)
Calls and ask again for percocet, he was told as he was told 10/18 that per dr's butcher and sidhu that narcotics would no longer be prescribed, he said there was a mistake and that it was the nurse that didn't do the test right and that if his urine was not right why did the md give him a script last visit, he was told he was given 1 more chance and again urine did show medicine. He was told he could speak to the md at his 11/02 appt, he hung up on me

## 2010-12-21 NOTE — Telephone Encounter (Signed)
i have attempted to call pt back, no answer or vmail. This request is closed

## 2010-12-26 ENCOUNTER — Telehealth: Payer: Self-pay | Admitting: *Deleted

## 2010-12-26 NOTE — Telephone Encounter (Signed)
Call from pt would like to speak with Dr. Baltazar Apo about his pain medication denial.  Pt said that he is in a lot of pain and needs his pain med.  Does not understand why it is being denied.  Pt when asked what he is doing about his pain now said that he is taking a yellow pain pill that was given to him by a doctor a whole back.  Helps a little he said.  Wants a return call from Dr. Baltazar Apo at [phone].  Angelina Ok, RN 12/26/2010 10:20 AM

## 2010-12-26 NOTE — Telephone Encounter (Signed)
I tried calling the patient several times, however keep getting a busy tone. Patient has violated his pain contracted numerous times, and has had negative UDS even when he has brought the medication bottles with him and states he took a pill the morning of the appt. Furthermore, he has accused Education administrator of "messing with UDS results." A total opiates screen was done, as our UDS will not pick up Percocet, and even that has been negative. He will no longer be receiving pain medication from [facility].

## 2011-01-04 ENCOUNTER — Encounter: Payer: Medicaid Other | Admitting: Internal Medicine

## 2011-01-09 ENCOUNTER — Ambulatory Visit (INDEPENDENT_AMBULATORY_CARE_PROVIDER_SITE_OTHER): Payer: Medicaid Other | Admitting: Internal Medicine

## 2011-01-09 ENCOUNTER — Encounter: Payer: Self-pay | Admitting: Internal Medicine

## 2011-01-09 DIAGNOSIS — C329 Malignant neoplasm of larynx, unspecified: Secondary | ICD-10-CM

## 2011-01-10 NOTE — Assessment & Plan Note (Signed)
Patient states chronic pain stems from laryngeal carcinoma and that he needs percocet. His last 5 drug screens have been negative. He has provided explanations for each test (ran out of medication, lab error, medication stolen, etc). He has not only violated pain contract for negative drug screens but also for several no show, and calling for refills early. Patient wanted to discuss this with attending, and thus Dr. Coralee Pesa also re-iterated violation of pain contract and he would no longer be prescribed controlled substances at this clinic. He was told that he would continue to receive medical care for all of his problems, and can get refills of his other medications. Furthermore he was offered several different NSAID medications such as Ibuprofen extra strength, Meloxicam, Naprosyn, Diclofenac, all of which he refused. He also refused Tramadol, and states gabapentin did not control pain well. Pain clinic was also suggested for patient, however he also did not want to be referred. He will no longer receive controlled substances from this clinic.

## 2011-01-10 NOTE — Progress Notes (Signed)
  Subjective:    Patient ID: George Allen, male    DOB: 1950/09/21, 60 y.o.   MRN: [id number]  HPI Patient presents for refill of pain medications. Past medical history significant for Hep C, Depression, HIV, Laryngeal carcinoma s/p laryngectomy, and chronic pain.  Patient violated pain contract several times and would like to discuss this in detail. He is still complaining of pain in different areas including shoulder, neck, and back.   No other complaints or concerns.   Review of Systems  All other systems reviewed and are negative.       Objective:   Physical Exam   Patient left room before physical exam conducted.      Assessment & Plan:

## 2011-01-14 ENCOUNTER — Emergency Department (HOSPITAL_COMMUNITY)
Admission: EM | Admit: 2011-01-14 | Discharge: 2011-01-15 | Disposition: A | Discharge status: Home / Self Care | Payer: Medicaid Other | Attending: Emergency Medicine | Admitting: Emergency Medicine

## 2011-01-14 ENCOUNTER — Encounter (HOSPITAL_COMMUNITY): Payer: Self-pay | Admitting: *Deleted

## 2011-01-14 DIAGNOSIS — Z21 Asymptomatic human immunodeficiency virus [HIV] infection status: Secondary | ICD-10-CM | POA: Insufficient documentation

## 2011-01-14 DIAGNOSIS — S0100XA Unspecified open wound of scalp, initial encounter: Secondary | ICD-10-CM | POA: Insufficient documentation

## 2011-01-14 DIAGNOSIS — S0990XA Unspecified injury of head, initial encounter: Secondary | ICD-10-CM | POA: Insufficient documentation

## 2011-01-14 DIAGNOSIS — Z8521 Personal history of malignant neoplasm of larynx: Secondary | ICD-10-CM | POA: Insufficient documentation

## 2011-01-14 DIAGNOSIS — S0101XA Laceration without foreign body of scalp, initial encounter: Secondary | ICD-10-CM

## 2011-01-14 DIAGNOSIS — R229 Localized swelling, mass and lump, unspecified: Secondary | ICD-10-CM | POA: Insufficient documentation

## 2011-01-14 DIAGNOSIS — F17200 Nicotine dependence, unspecified, uncomplicated: Secondary | ICD-10-CM | POA: Insufficient documentation

## 2011-01-14 DIAGNOSIS — R51 Headache: Secondary | ICD-10-CM | POA: Insufficient documentation

## 2011-01-14 DIAGNOSIS — Z8619 Personal history of other infectious and parasitic diseases: Secondary | ICD-10-CM | POA: Insufficient documentation
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
## 2011-01-15 ENCOUNTER — Emergency Department (HOSPITAL_COMMUNITY): Payer: Medicaid Other
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
## 2011-01-15 NOTE — ED Provider Notes (Signed)
Medical screening examination/treatment/procedure(s) were performed by non-physician practitioner and as supervising physician I was immediately available for consultation/collaboration. Devoria Albe, MD, FACEP   Ward Givens, MD 01/15/11 [phone]
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
## 2011-03-04 ENCOUNTER — Other Ambulatory Visit: Payer: Self-pay | Admitting: Internal Medicine

## 2011-03-04 DIAGNOSIS — B20 Human immunodeficiency virus [HIV] disease: Secondary | ICD-10-CM

## 2011-03-12 ENCOUNTER — Other Ambulatory Visit (INDEPENDENT_AMBULATORY_CARE_PROVIDER_SITE_OTHER): Payer: Medicaid Other

## 2011-03-12 ENCOUNTER — Other Ambulatory Visit: Payer: Self-pay | Admitting: Infectious Diseases

## 2011-03-12 DIAGNOSIS — Z113 Encounter for screening for infections with a predominantly sexual mode of transmission: Secondary | ICD-10-CM

## 2011-03-12 DIAGNOSIS — Z79899 Other long term (current) drug therapy: Secondary | ICD-10-CM

## 2011-03-12 DIAGNOSIS — B20 Human immunodeficiency virus [HIV] disease: Secondary | ICD-10-CM

## 2011-03-13 LAB — COMPREHENSIVE METABOLIC PANEL
ALT: 59 U/L — ABNORMAL HIGH (ref 0–53)
AST: 86 U/L — ABNORMAL HIGH (ref 0–37)
Albumin: 4 g/dL (ref 3.5–5.2)
Alkaline Phosphatase: 131 U/L — ABNORMAL HIGH (ref 39–117)
Potassium: 3.6 mEq/L (ref 3.5–5.3)
Sodium: 139 mEq/L (ref 135–145)
Total Protein: 7.7 g/dL (ref 6.0–8.3)

## 2011-03-13 LAB — CBC WITH DIFFERENTIAL/PLATELET
Basophils Absolute: 0 10*3/uL (ref 0.0–0.1)
Basophils Relative: 1 % (ref 0–1)
Hemoglobin: 14.4 g/dL (ref 13.0–17.0)
Lymphocytes Relative: 21 % (ref 12–46)
MCHC: 35.2 g/dL (ref 30.0–36.0)
Neutro Abs: 3.9 10*3/uL (ref 1.7–7.7)
Neutrophils Relative %: 71 % (ref 43–77)
RDW: 13.3 % (ref 11.5–15.5)
WBC: 5.6 10*3/uL (ref 4.0–10.5)

## 2011-03-13 LAB — LIPID PANEL
Cholesterol: 212 mg/dL — ABNORMAL HIGH (ref 0–200)
HDL: 117 mg/dL (ref >39)
Total CHOL/HDL Ratio: 1.8 Ratio
VLDL: 21 mg/dL (ref 0–40)

## 2011-03-13 LAB — T-HELPER CELL (CD4) - (RCID CLINIC ONLY): CD4 % Helper T Cell: 34 % (ref 33–55)

## 2011-03-14 LAB — HIV-1 RNA QUANT-NO REFLEX-BLD
HIV 1 RNA Quant: 25 copies/mL — ABNORMAL HIGH (ref <20)
HIV-1 RNA Quant, Log: 1.4 {Log} — ABNORMAL HIGH (ref <1.30)

## 2011-03-26 ENCOUNTER — Telehealth: Payer: Self-pay | Admitting: *Deleted

## 2011-03-26 ENCOUNTER — Ambulatory Visit: Payer: Medicaid Other | Admitting: Internal Medicine

## 2011-03-26 NOTE — Telephone Encounter (Signed)
Called patient and left message to call and reschedule his appointment.  He no showed today. Wendall Mola CMA

## 2011-04-15 NOTE — Assessment & Plan Note (Signed)
We will refill his Xanax as this is something we have been doing. However, his pain medication must be refilled by his primary care office, as they were the original prescribers.

## 2011-04-15 NOTE — Assessment & Plan Note (Signed)
HCV genotype 1B. Would recommend treatment with an all oral therapy, which should be available sometime after the first of next year.

## 2011-04-15 NOTE — Assessment & Plan Note (Signed)
Clinically stable on current regimen. Continue present management.  Counseling provided on prevention of transmission of HIV. Condoms offered:  Yes Medication adherence discussed with patient. Referrals: Primary Care Provider Follow up visit in 4 months with labs 2 weeks prior to appointment. Patient verbally acknowledged information provided to them and agreed with plan of care.

## 2011-04-15 NOTE — Progress Notes (Signed)
Subjective:    Patient ID: George Allen is a 61 y.o. male.  Chief Complaint: HIV Follow-up Visit George Allen is here for follow-up of HIV infection. He is feeling unchanged since his last visit.  He claims continued adherence to therapy with good tolerance and no complications. There are additional complaints. Continued chronic anxiety as subjectively reported by he as well as chronic shoulder pain, for which he was receiving pain medication through his primary care provider. We were providing alprazolam therapy for his anxiety.  Data Review: Diagnostic studies reviewed.  Review of Systems - General ROS: negative for - fatigue, fever or malaise Psychological ROS: positive for - anxiety negative for - behavioral disorder, depression, irritability or mood swings Ophthalmic ROS: negative ENT ROS: negative Respiratory ROS: no cough, shortness of breath, or wheezing Cardiovascular ROS: no chest pain or dyspnea on exertion Gastrointestinal ROS: no abdominal pain, change in bowel habits, or black or bloody stools Musculoskeletal ROS: negative Neurological ROS: no TIA or stroke symptoms Dermatological ROS: negative for pruritus, rash and skin lesion changes  Objective:   General appearance: alert, cooperative, appears older than stated age and no distress Head: Normocephalic, without obvious abnormality, atraumatic Eyes: conjunctivae/corneas clear. PERRL, EOM's intact. Fundi benign. Ears: normal TM's and external ear canals both ears Throat: lips, mucosa, and tongue normal; teeth and gums normal Neck: no adenopathy, no carotid bruit, no JVD, supple, symmetrical, trachea midline and thyroid not enlarged, symmetric, no tenderness/mass/nodules Resp: rhonchi bibasilar Cardio: regular rate and rhythm, S1, S2 normal, no murmur, click, rub or gallop GI: soft, non-tender; bowel sounds normal; no masses,  no organomegaly Skin: Skin color, texture, turgor normal. No rashes or  lesions Neurologic: Grossly normal Psych:  No vegetative signs or delusional behaviors noted.    Laboratory: From 05/14/2010 ,  CD4 count was 400 c/cmm @ 20 %. Viral load 31 copies/ml.     Assessment/Plan:   HEPATITIS C HCV genotype 1B. Would recommend treatment with an all oral therapy, which should be available sometime after the first of next year.  HIV INFECTION Clinically stable on current regimen. Continue present management.  Counseling provided on prevention of transmission of HIV. Condoms offered:  Yes Medication adherence discussed with patient. Referrals: Primary Care Provider Follow up visit in 4 months with labs 2 weeks prior to appointment. Patient verbally acknowledged information provided to them and agreed with plan of care.   ANXIETY, CHRONIC We will refill his Xanax as this is something we have been doing. However, his pain medication must be refilled by his primary care office, as they were the original prescribers.     Sharman Garrott A. Sundra Aland, MS, [facility] [phone]  04/15/2011, 11:52 AM

## 2011-08-16 ENCOUNTER — Other Ambulatory Visit: Payer: Self-pay | Admitting: Licensed Clinical Social Worker

## 2011-08-16 DIAGNOSIS — B20 Human immunodeficiency virus [HIV] disease: Secondary | ICD-10-CM

## 2011-08-16 MED ORDER — EFAVIRENZ-EMTRICITAB-TENOFOVIR 600-200-300 MG PO TABS: 1.0000 | ORAL_TABLET | Freq: Every day | ORAL | Status: DC

## 2011-10-12 IMAGING — CT NM PET TUM IMG INITIAL (PI) SKULL BASE T - THIGH
6 series · 25 of 25 positions shown · IV contrast (350 OM)
Comparison: Plain film chest of 01/09/2009.  Prior chest CT of
12/06/2008.

CLINICAL DATA: Initial treatment strategy for right-sided lung
nodule.

NUCLEAR MEDICINE PET CT SKULL BASE TO THIGH
TECHNIQUE: 17.4 mCi F-18 FDG was injected intravenously via the
right AC.  Full-ring PET imaging was performed from the skull base
through the mid-thighs 85  minutes after injection.  CT data was
obtained and used for attenuation correction and anatomic
localization only.  (This was not acquired as a diagnostic CT
examination.)
Fasting Blood Glucose:  91

[Series 1: pet ac · axial · 3.3mm · 4.69mm/px · z∈[-870,+0]mm · 5 of 267 slices shown]
[im 1/267]
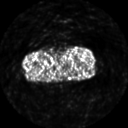
[im 67/267]
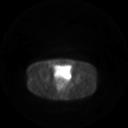
[im 134/267]
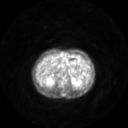
[im 200/267]
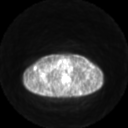
[im 267/267]
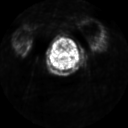

[Series 2: pet nac · axial · 3.3mm · 4.69mm/px · z∈[-870,+0]mm · 6 of 267 slices shown]
[im 1/267]
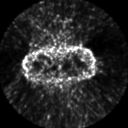
[im 54/267]
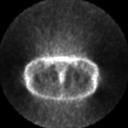
[im 107/267]
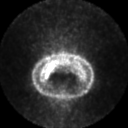
[im 160/267]
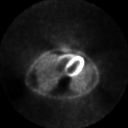
[im 213/267]
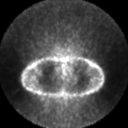
[im 267/267]
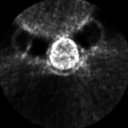

[Series 2: ct images · axial · 3.8mm · 0.98mm/px · z∈[-870,+0]mm · 6 of 267 slices shown]
[im 1/267]
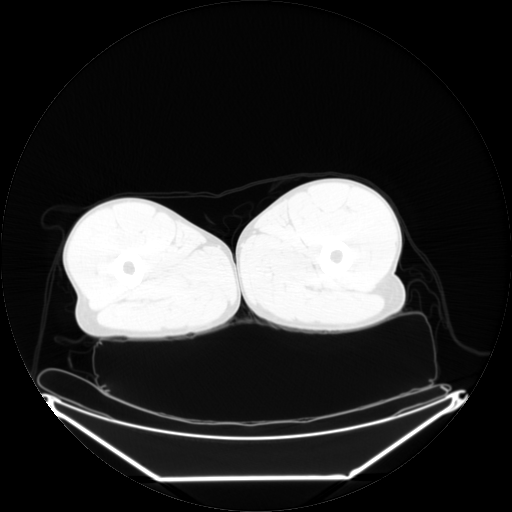
[im 54/267]
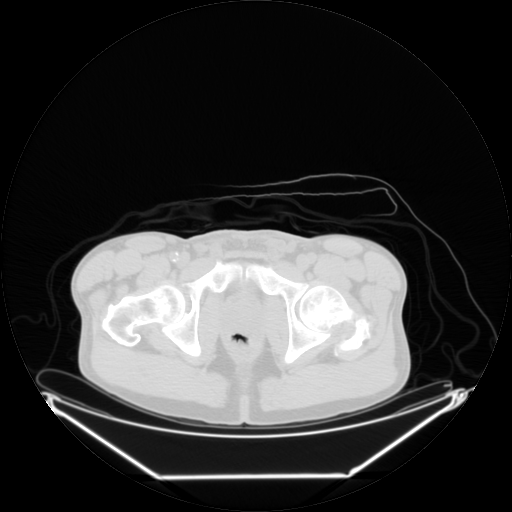
[im 107/267]
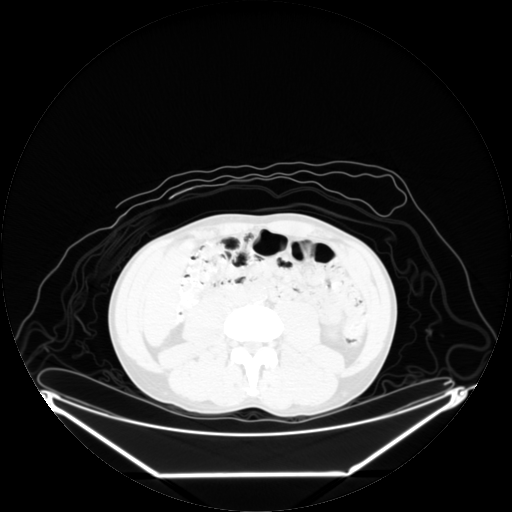
[im 160/267]
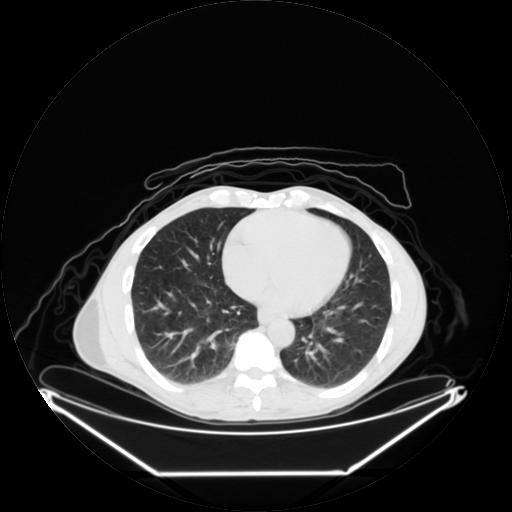
[im 213/267]
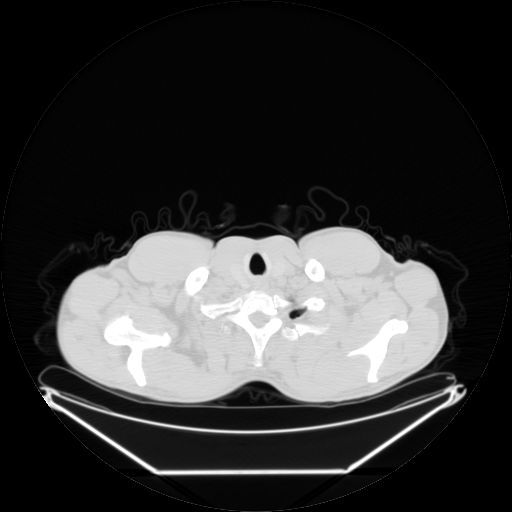
[im 267/267  brain]
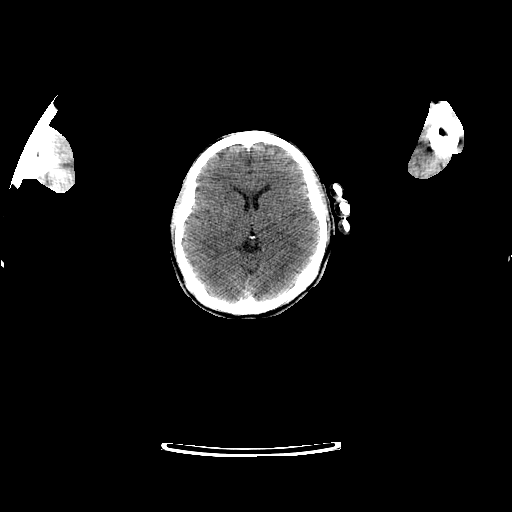

[Series 123: mip · coronal · 3.3mm · 4.69mm/px · 1 of 30 slices shown]
[im 1/30]
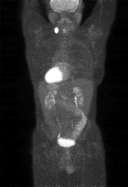

[Series 151: reformatted · axial · 3.3mm · 3.91mm/px · z∈[-870,+0]mm · 6 of 265 slices shown (1 of 2)]
[im 1/265]
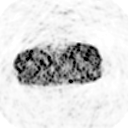
[im 53/265]
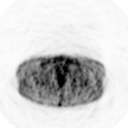
[im 106/265]
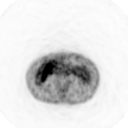
[im 159/265]
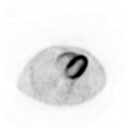
[im 212/265]
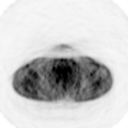
[im 265/265]
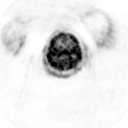

[Series 153: reformatted · coronal · 4.7mm · 6.98mm/px · 1 of 63 slices shown (2 of 2)]
[im 1/63]
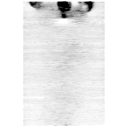

[25 of 25 positions shown; findings below may reference images not displayed]

FINDINGS: PET images demonstrate hypermetabolism centered at the
left sided aryepiglottic fold and piriform sinus.  Slight apparent
soft tissue fullness in this area which could be due to
underdistension.  Image 42.

A level three left-sided node measures 1.5 x 1.4 cm and a S.U.V.
max of 36.4 on image 46.

Low level hypermetabolism involves normal sized axillary lymph
nodes bilaterally.  Index right axillary node measures a S.U.V. max
of 2.0 on image 63. Nonspecific.

Corresponding to the CT abnormality, in the posterior medial aspect
of the right lower lobe is ill-defined opacity.  Similar.  This
demonstrates borderline/mild hypermetabolism, and measures a S.U.V.
max of 2.7 on image 111.  This has morphology which is more
consistent with atelectasis or scarring on CT.

No abnormal activity within the abdomen or pelvis.

CT images performed for attenuation correction demonstrate no
further findings within the neck.

Early but age advanced coronary artery atherosclerosis.
Perifissural 5 mm nodule on the right on image 100 which is
unchanged and below the resolution of PET.  Right chest wall lipoma
of 6.3 x 2.6 cm on image 111.  Probable gallstone.  Age advanced
aortic atherosclerosis.  Abnormal appearance of right hemi scrotum
with mixed fluid and soft tissue density. A left lower lobe lung
nodule is similar at 7 mm on image 105.
IMPRESSION: 1.  The right lower lobe opacity is mildly hypermetabolic.  Its
morphology, however is most consistent with scarring or atelectasis
adjacent to a vertebral osteophyte.  Consider follow-up with chest
CT in approximately 3 months to confirm stability.
2. Markedly hypermetabolic left-sided level III lymph node.
Considerations include most likely metastatic disease.  A prior
chest CT describes a history of positive PPD.  Therefore active
tuberculosis should also be considered.  Consider tissue sampling.
3.  Left aryepiglottic fold/piriform sinus hypermetabolism and
possible soft tissue fullness.  Given its location, immediately
adjacent to the hypermetabolic enlarged lymph node, mucosal
malignancy cannot be excluded.  Depending on the results of the
nodal biopsy, consider direct endoscopic visualization of this
area.
4.  Other small lung nodules which are unchanged and below the
resolution of PET.
5.  Abnormal appearance of the right hemi scrotum.  This may
represent a complex hydrocele.  Consider correlation with
ultrasound and physical exam.

## 2011-11-08 ENCOUNTER — Encounter: Payer: Self-pay | Admitting: *Deleted

## 2011-11-08 ENCOUNTER — Other Ambulatory Visit: Payer: Medicare Other

## 2011-11-08 DIAGNOSIS — B20 Human immunodeficiency virus [HIV] disease: Secondary | ICD-10-CM

## 2011-11-08 LAB — CBC WITH DIFFERENTIAL/PLATELET
Eosinophils Absolute: 0.1 10*3/uL (ref 0.0–0.7)
Eosinophils Relative: 1 % (ref 0–5)
HCT: 44.2 % (ref 39.0–52.0)
Hemoglobin: 15.3 g/dL (ref 13.0–17.0)
Lymphocytes Relative: 16 % (ref 12–46)
Lymphs Abs: 1.2 10*3/uL (ref 0.7–4.0)
MCH: 34 pg (ref 26.0–34.0)
MCV: 98.2 fL (ref 78.0–100.0)
Monocytes Absolute: 0.6 10*3/uL (ref 0.1–1.0)
Monocytes Relative: 8 % (ref 3–12)
Platelets: 248 10*3/uL (ref 150–400)
RBC: 4.5 MIL/uL (ref 4.22–5.81)

## 2011-11-08 LAB — COMPLETE METABOLIC PANEL WITH GFR
BUN: 9 mg/dL (ref 6–23)
CO2: 23 mEq/L (ref 19–32)
Calcium: 9.7 mg/dL (ref 8.4–10.5)
Chloride: 103 mEq/L (ref 96–112)
Creat: 0.74 mg/dL (ref 0.50–1.35)
GFR, Est African American: >89 mL/min
GFR, Est Non African American: >89 mL/min
Glucose, Bld: 96 mg/dL (ref 70–99)
Total Bilirubin: 0.5 mg/dL (ref 0.3–1.2)

## 2011-11-08 LAB — T-HELPER CELL (CD4) - (RCID CLINIC ONLY): CD4 % Helper T Cell: 28 % — ABNORMAL LOW (ref 33–55)

## 2011-11-08 NOTE — Progress Notes (Unsigned)
Pt presents c/o pain in neck and shoulders requesting percocet, he also c/o dizziness x 1 month upon rising in am. No resp distress noted. Denies chest pain. Ask for percocet until appt, discussed why pain meds were discontinued, appt given and pt instructed if he becomes worse or feels he cannot wait for appt he may go to ED or urg care. He states if "they will give me some percocet..." he is informed that the ED and urg care will see where pain med was d'cd but it will be totally up to the physician seeing him. He accepts the appt and leaves, appt set for mon 9/9 dr Anselm Jungling per chilonb.

## 2011-11-11 ENCOUNTER — Ambulatory Visit (INDEPENDENT_AMBULATORY_CARE_PROVIDER_SITE_OTHER): Payer: Medicare Other | Admitting: Internal Medicine

## 2011-11-11 VITALS — BP 140/90 | HR 64 | Temp 97.5°F | Ht 64.0 in | Wt 141.7 lb

## 2011-11-11 DIAGNOSIS — M542 Cervicalgia: Secondary | ICD-10-CM

## 2011-11-11 DIAGNOSIS — R42 Dizziness and giddiness: Secondary | ICD-10-CM | POA: Insufficient documentation

## 2011-11-11 DIAGNOSIS — Z23 Encounter for immunization: Secondary | ICD-10-CM

## 2011-11-11 DIAGNOSIS — C329 Malignant neoplasm of larynx, unspecified: Secondary | ICD-10-CM

## 2011-11-11 MED ORDER — IBUPROFEN 600 MG PO TABS: 600.0000 mg | ORAL_TABLET | Freq: Four times a day (QID) | ORAL | Status: AC | PRN

## 2011-11-11 MED ORDER — GABAPENTIN 300 MG PO CAPS: ORAL_CAPSULE | ORAL | Status: DC

## 2011-11-11 NOTE — Assessment & Plan Note (Signed)
Likely neuropathic pain post neck surgery.  Patient has violated pain contract last year with negative UDS and we are not prescribing any narcotics to him according to our policy.  I discussed with patient that we can try gabapentin again to see if it helps and that it would take time for him to see the benefits and he agreed.  I will also send him to pain clinic for better pain control management.   -Start gabapentin -Pain clinic referral -Give Ibuprofen 600mg  po q6hr prn pain in the mean time

## 2011-11-11 NOTE — Progress Notes (Signed)
HPI: George Allen is a 61 yo M with PMH of depression, hep C, HIV, laryngeal carcinoma presents today for dizziness and pain medication refill. Of note, he has violated pain med contract due to negative UDS and no opiates should be given under any circumstances per Dr. Ashley Royalty.   Left neck pain/numbness/tingling: he has been taking pain med over the counter at [facility] but not helping.  7/10 in severity. Pain has been there since he had neck surgery.   He said he tried nerve pill before but didn't take it long enough to see if it really works.  Dizziness: 1 month in duration, room spinning sensation when he first gets up in the morning and sometimes while he is walking.  He also reports some lightheadedness but more vertigo sensation most of the time.  Denies any N/V/headache. He has some blurry vision which is chronic.  He denies any weakness or numbness of his body beside the chronic neck numbness.     ROS: as per HPI  PE: General: alert, well-developed talking with a device, and cooperative to examination.  Head: normocephalic and atraumatic.  Mouth: pharynx pink and moist, no erythema, and no exudates.  Neck: 1.5cm circular tracheostomy without any erythema or drainage covered by bandage. No lymphadenopathy noted  Lungs: normal respiratory effort, no accessory muscle use, normal breath sounds, no crackles, and no wheezes. Heart: normal rate, regular rhythm, no murmur, no gallop, and no rub.  Abdomen: soft, non-tender, normal bowel sounds, no distention, no guarding, no rebound tenderness Msk: no joint swelling, no joint warmth, and no redness over joints.  Pulses: 2+ DP/PT pulses bilaterally Extremities: No cyanosis, clubbing, edema Neurologic: alert & oriented X3, cranial nerves II-XII intact, strength normal in all extremities, sensation intact to light touch, and gait normal.  Skin: turgor normal and left lower ankle: 1cm circular lesion with scabbing and pink granulation  tissue(he reports being stung by a bee years ago), no erythema or fluctuant or induration or drainage Psych: Oriented X3, memory intact for recent and remote, normally interactive, good eye contact,+ anxious appearing, and not depressed appearing.

## 2011-11-11 NOTE — Patient Instructions (Addendum)
Please take Ibuprofen 600mg  one tablet every 6 hours as needed for pain Please start taking Gabapentin as prescribed, this will help your numbness and tingling in your neck We will refer you to pain clinic for better pain control Will refer to vestibular rehab to help with your dizziness Follow up in 1 month if no improvement

## 2011-11-11 NOTE — Assessment & Plan Note (Signed)
Patient described sensation of room spinning with certain head position in bed and out of bed. I checked his orthostatic vitals today which was normal. Unlikely stroke because he denies any weakness or any focal neurological deficit. -Will refer patient to vestibular rehabilitation

## 2011-11-21 ENCOUNTER — Ambulatory Visit: Payer: Medicare Other | Admitting: Internal Medicine

## 2011-11-21 ENCOUNTER — Ambulatory Visit (INDEPENDENT_AMBULATORY_CARE_PROVIDER_SITE_OTHER): Payer: Medicare Other | Admitting: Internal Medicine

## 2011-11-21 VITALS — BP 150/87 | HR 62 | Temp 98.1°F | Wt 143.0 lb

## 2011-11-21 DIAGNOSIS — F411 Generalized anxiety disorder: Secondary | ICD-10-CM

## 2011-11-21 DIAGNOSIS — F419 Anxiety disorder, unspecified: Secondary | ICD-10-CM

## 2011-11-21 MED ORDER — PAROXETINE HCL 20 MG PO TABS: 20.0000 mg | ORAL_TABLET | ORAL | Status: DC
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
## 2011-12-10 NOTE — Addendum Note (Signed)
Addended by: Neomia Dear on: 12/10/2011 05:02 PM   Modules accepted: Orders

## 2011-12-12 ENCOUNTER — Ambulatory Visit: Payer: Medicare Other | Attending: Internal Medicine | Admitting: Physical Therapy

## 2011-12-12 NOTE — Addendum Note (Signed)
Addended by: Neomia Dear on: 12/12/2011 06:32 PM   Modules accepted: Orders

## 2011-12-23 ENCOUNTER — Other Ambulatory Visit: Payer: Medicare Other

## 2011-12-23 ENCOUNTER — Other Ambulatory Visit: Payer: Self-pay | Admitting: Internal Medicine

## 2011-12-23 DIAGNOSIS — B20 Human immunodeficiency virus [HIV] disease: Secondary | ICD-10-CM

## 2011-12-23 DIAGNOSIS — Z113 Encounter for screening for infections with a predominantly sexual mode of transmission: Secondary | ICD-10-CM

## 2012-01-09 ENCOUNTER — Ambulatory Visit: Payer: Medicare Other | Admitting: Internal Medicine

## 2012-02-13 ENCOUNTER — Ambulatory Visit: Payer: Medicare Other | Admitting: Internal Medicine

## 2012-02-18 ENCOUNTER — Ambulatory Visit: Payer: Medicare Other | Admitting: Internal Medicine

## 2012-03-17 IMAGING — CR DG ABD PORTABLE 1V
1 series · 1 of 1 positions shown · non-contrast
Comparison: None.

CLINICAL DATA: Laryngeal cancer.  Feeding tube placement.

ABDOMEN - 1 VIEW

[view not recorded]
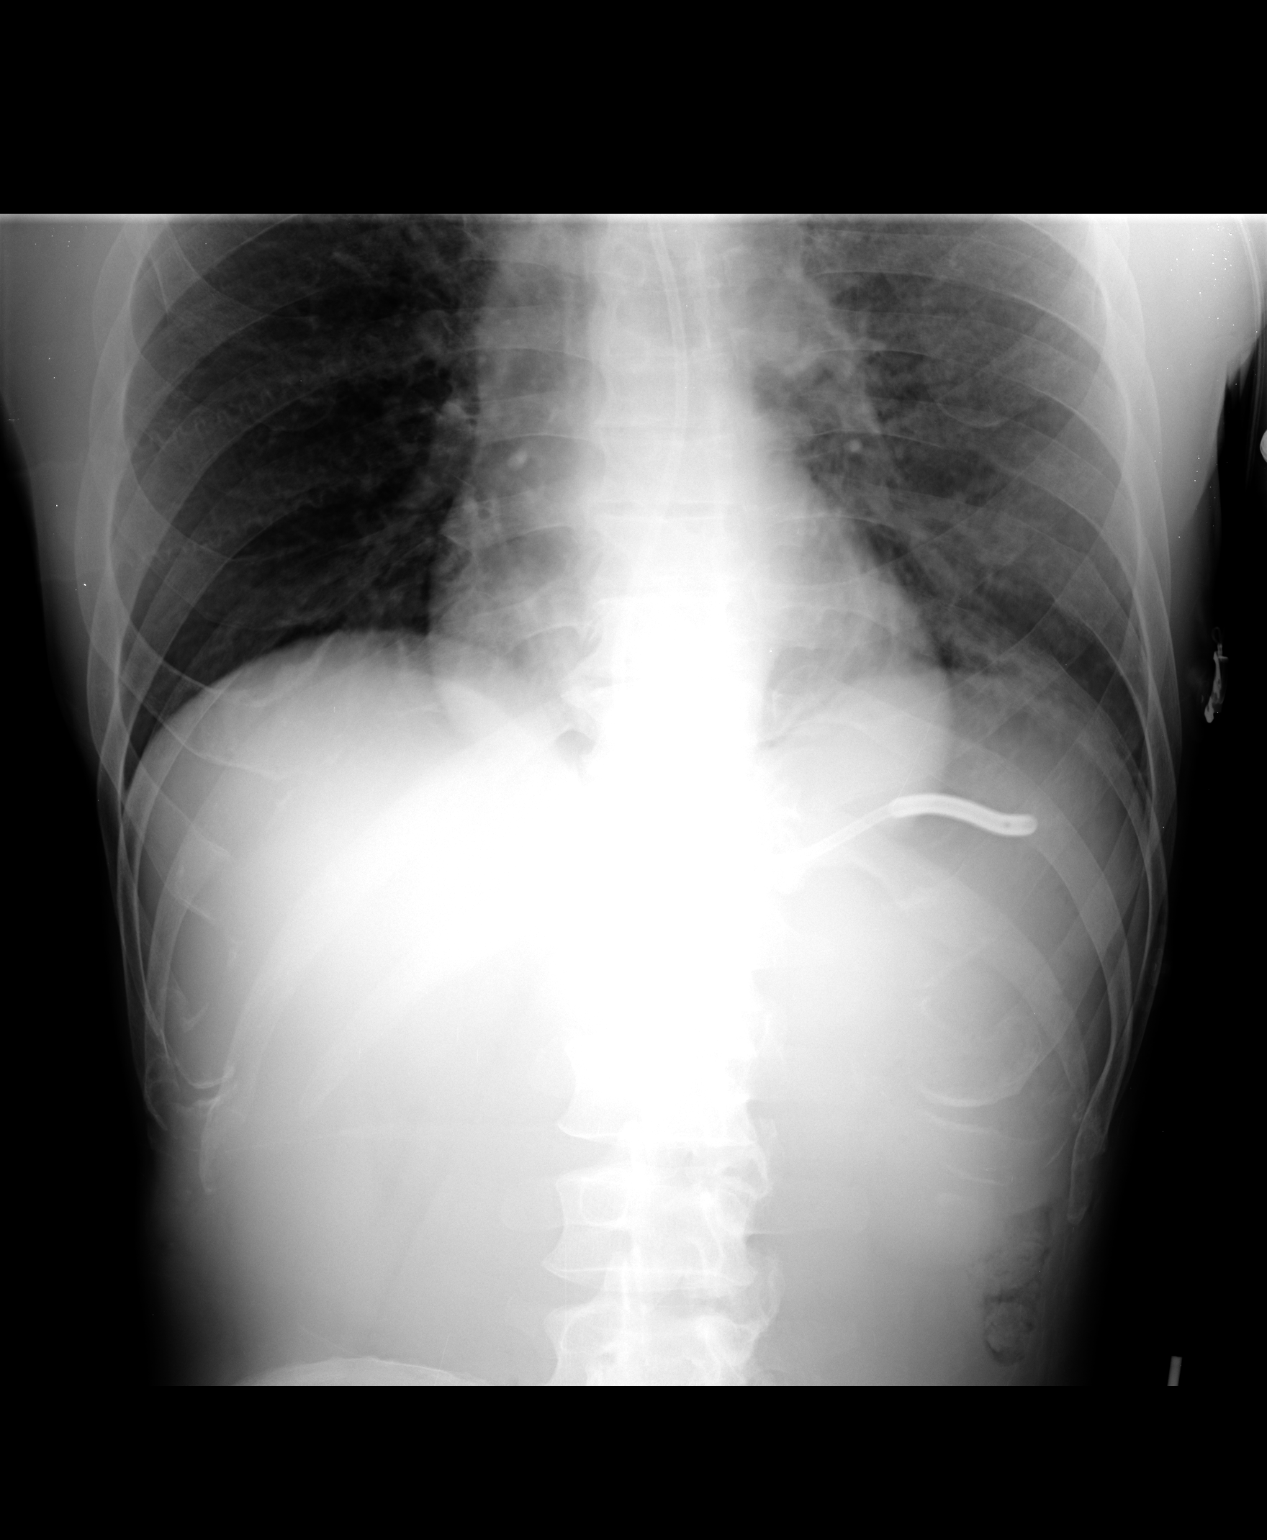

[1 of 1 positions shown; findings below may reference images not displayed]

FINDINGS: 2841 hours.  The tip of the feeding catheter is in the
gastric fundus.  Paucity of bowel gas is evident in the abdomen.
There is some bibasilar atelectasis, left greater than right.
IMPRESSION: Feeding tube tip is in the proximal stomach.

## 2012-03-25 ENCOUNTER — Other Ambulatory Visit: Payer: Self-pay | Admitting: *Deleted

## 2012-03-25 DIAGNOSIS — B20 Human immunodeficiency virus [HIV] disease: Secondary | ICD-10-CM

## 2012-03-25 MED ORDER — EFAVIRENZ-EMTRICITAB-TENOFOVIR 600-200-300 MG PO TABS: 1.0000 | ORAL_TABLET | Freq: Every day | ORAL | Status: DC

## 2012-03-27 ENCOUNTER — Other Ambulatory Visit: Payer: Medicare Other

## 2012-04-16 ENCOUNTER — Ambulatory Visit: Payer: Medicare Other | Admitting: Internal Medicine

## 2012-04-22 ENCOUNTER — Telehealth: Payer: Self-pay | Admitting: *Deleted

## 2012-04-22 NOTE — Telephone Encounter (Signed)
"  [facility]" appt needs rescheduling
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
## 2012-10-07 ENCOUNTER — Telehealth: Payer: Self-pay | Admitting: *Deleted

## 2012-10-07 NOTE — Telephone Encounter (Signed)
Called patient to try and schedule an appt and was unable to reach him. Not able to leave a message phone just rang and no voicemail ever picked up.

## 2012-12-23 ENCOUNTER — Telehealth: Payer: Self-pay | Admitting: Radiology

## 2012-12-23 NOTE — Telephone Encounter (Signed)
Spoke to [facility], they are asking for an order for patient to have a suction machine. Patient has not been seen by Dr Clelia Croft since she has been at [facility]/ I have advised [facility] to have him call and schedule an office visit for this and his other needs.

## 2013-04-15 ENCOUNTER — Other Ambulatory Visit: Payer: Self-pay | Admitting: *Deleted

## 2013-04-15 ENCOUNTER — Telehealth: Payer: Self-pay | Admitting: *Deleted

## 2013-04-15 DIAGNOSIS — Z79899 Other long term (current) drug therapy: Secondary | ICD-10-CM

## 2013-04-15 DIAGNOSIS — B20 Human immunodeficiency virus [HIV] disease: Secondary | ICD-10-CM

## 2013-04-15 DIAGNOSIS — Z113 Encounter for screening for infections with a predominantly sexual mode of transmission: Secondary | ICD-10-CM

## 2013-04-15 MED ORDER — EFAVIRENZ-EMTRICITAB-TENOFOVIR 600-200-300 MG PO TABS: 1.0000 | ORAL_TABLET | Freq: Every day | ORAL | Status: DC

## 2013-04-15 NOTE — Telephone Encounter (Signed)
Received fax from [facility] ([place], [place]) requesting refill of atripla.  Patient last seen 18 months ago, was supposed to follow up in 1 month.  Patient previously seen 18 months before that appointment.  RN spoke with pharmacy, he has been filling Atripla with regularity from them.  Patient states he hasn't missed a dose, but is out today.  RN authorized 30 days with the condition that the patient keep his 3/5 appointment with Dr. Baxter Flattery.  Patient now lives in [place], will need labs on the same day as transportation is an issue.   Landis Gandy, RN

## 2013-05-03 ENCOUNTER — Encounter: Payer: Self-pay | Admitting: Internal Medicine

## 2013-05-03 ENCOUNTER — Ambulatory Visit (INDEPENDENT_AMBULATORY_CARE_PROVIDER_SITE_OTHER): Payer: Commercial Managed Care - HMO | Admitting: Internal Medicine

## 2013-05-03 VITALS — BP 164/109 | HR 103 | Temp 97.8°F | Wt 147.0 lb

## 2013-05-03 DIAGNOSIS — F411 Generalized anxiety disorder: Secondary | ICD-10-CM

## 2013-05-03 DIAGNOSIS — F1020 Alcohol dependence, uncomplicated: Secondary | ICD-10-CM | POA: Diagnosis not present

## 2013-05-03 DIAGNOSIS — B182 Chronic viral hepatitis C: Secondary | ICD-10-CM

## 2013-05-03 DIAGNOSIS — Z0000 Encounter for general adult medical examination without abnormal findings: Secondary | ICD-10-CM

## 2013-05-03 DIAGNOSIS — B20 Human immunodeficiency virus [HIV] disease: Secondary | ICD-10-CM

## 2013-05-03 DIAGNOSIS — Z23 Encounter for immunization: Secondary | ICD-10-CM | POA: Diagnosis not present

## 2013-05-03 LAB — CBC WITH DIFFERENTIAL/PLATELET
Basophils Absolute: 0.1 10*3/uL (ref 0.0–0.1)
Basophils Relative: 1 % (ref 0–1)
EOS ABS: 0.1 10*3/uL (ref 0.0–0.7)
EOS PCT: 1 % (ref 0–5)
HEMATOCRIT: 41.6 % (ref 39.0–52.0)
Hemoglobin: 15.1 g/dL (ref 13.0–17.0)
LYMPHS ABS: 0.9 10*3/uL (ref 0.7–4.0)
LYMPHS PCT: 15 % (ref 12–46)
MCH: 34.5 pg — AB (ref 26.0–34.0)
MCHC: 36.3 g/dL — ABNORMAL HIGH (ref 30.0–36.0)
MCV: 95 fL (ref 78.0–100.0)
MONO ABS: 0.5 10*3/uL (ref 0.1–1.0)
Monocytes Relative: 9 % (ref 3–12)
Neutro Abs: 4.2 10*3/uL (ref 1.7–7.7)
Neutrophils Relative %: 74 % (ref 43–77)
PLATELETS: 221 10*3/uL (ref 150–400)
RBC: 4.38 MIL/uL (ref 4.22–5.81)
RDW: 13.1 % (ref 11.5–15.5)
WBC: 5.7 10*3/uL (ref 4.0–10.5)

## 2013-05-03 LAB — COMPLETE METABOLIC PANEL WITH GFR
ALT: 90 U/L — ABNORMAL HIGH (ref 0–53)
AST: 137 U/L — ABNORMAL HIGH (ref 0–37)
Albumin: 4.4 g/dL (ref 3.5–5.2)
Alkaline Phosphatase: 182 U/L — ABNORMAL HIGH (ref 39–117)
BILIRUBIN TOTAL: 0.6 mg/dL (ref 0.2–1.2)
BUN: 10 mg/dL (ref 6–23)
CALCIUM: 9.7 mg/dL (ref 8.4–10.5)
CHLORIDE: 102 meq/L (ref 96–112)
CO2: 23 meq/L (ref 19–32)
Creat: 0.72 mg/dL (ref 0.50–1.35)
Glucose, Bld: 87 mg/dL (ref 70–99)
Potassium: 3.3 mEq/L — ABNORMAL LOW (ref 3.5–5.3)
Sodium: 139 mEq/L (ref 135–145)
Total Protein: 8.8 g/dL — ABNORMAL HIGH (ref 6.0–8.3)

## 2013-05-03 MED ORDER — ENSURE PO LIQD: 1.0000 | Freq: Two times a day (BID) | ORAL | Status: DC

## 2013-05-03 MED ORDER — EFAVIRENZ-EMTRICITAB-TENOFOVIR 600-200-300 MG PO TABS: 1.0000 | ORAL_TABLET | Freq: Every day | ORAL | Status: DC
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
## 2013-05-04 LAB — RPR

## 2013-05-04 LAB — T-HELPER CELL (CD4) - (RCID CLINIC ONLY)
CD4 % Helper T Cell: 33 % (ref 33–55)
CD4 T CELL ABS: 290 /uL — AB (ref 400–2700)

## 2013-05-05 LAB — HIV-1 RNA QUANT-NO REFLEX-BLD

## 2013-05-06 ENCOUNTER — Ambulatory Visit: Payer: Medicare Other | Admitting: Internal Medicine

## 2013-05-31 ENCOUNTER — Telehealth: Payer: Self-pay | Admitting: *Deleted

## 2013-05-31 NOTE — Telephone Encounter (Signed)
Shared 042 labs.  Congratulated the pt on VL.  Encouraged pt to continue to take his HIV medications daily and to return to clinic in 6 months as advised.  Pt verbalized understanding.

## 2013-06-14 NOTE — ED Notes (Signed)
[facility] called to see if we have a different number on file for pt; number they provided same number we have on file

## 2014-02-08 ENCOUNTER — Other Ambulatory Visit: Payer: Commercial Managed Care - HMO

## 2014-02-08 DIAGNOSIS — Z79899 Other long term (current) drug therapy: Secondary | ICD-10-CM

## 2014-02-08 DIAGNOSIS — Z113 Encounter for screening for infections with a predominantly sexual mode of transmission: Secondary | ICD-10-CM

## 2014-02-08 DIAGNOSIS — B20 Human immunodeficiency virus [HIV] disease: Secondary | ICD-10-CM

## 2014-02-08 LAB — CBC WITH DIFFERENTIAL/PLATELET
Basophils Absolute: 0 10*3/uL (ref 0.0–0.1)
Basophils Relative: 1 % (ref 0–1)
EOS ABS: 0.1 10*3/uL (ref 0.0–0.7)
Eosinophils Relative: 2 % (ref 0–5)
HEMATOCRIT: 38.7 % — AB (ref 39.0–52.0)
Hemoglobin: 13.7 g/dL (ref 13.0–17.0)
LYMPHS ABS: 1 10*3/uL (ref 0.7–4.0)
Lymphocytes Relative: 24 % (ref 12–46)
MCH: 33.3 pg (ref 26.0–34.0)
MCHC: 35.4 g/dL (ref 30.0–36.0)
MCV: 94.2 fL (ref 78.0–100.0)
MONOS PCT: 16 % — AB (ref 3–12)
MPV: 11.3 fL (ref 9.4–12.4)
Monocytes Absolute: 0.6 10*3/uL (ref 0.1–1.0)
Neutro Abs: 2.3 10*3/uL (ref 1.7–7.7)
Neutrophils Relative %: 57 % (ref 43–77)
Platelets: 205 10*3/uL (ref 150–400)
RBC: 4.11 MIL/uL — ABNORMAL LOW (ref 4.22–5.81)
RDW: 13.5 % (ref 11.5–15.5)
WBC: 4 10*3/uL (ref 4.0–10.5)

## 2014-02-09 LAB — T-HELPER CELL (CD4) - (RCID CLINIC ONLY)
CD4 % Helper T Cell: 33 % (ref 33–55)
CD4 T Cell Abs: 410 /uL (ref 400–2700)

## 2014-02-09 LAB — HIV-1 RNA QUANT-NO REFLEX-BLD: HIV 1 RNA Quant: <20 copies/mL (ref <20)

## 2014-02-09 LAB — COMPLETE METABOLIC PANEL WITH GFR
ALT: 93 U/L — AB (ref 0–53)
AST: 229 U/L — AB (ref 0–37)
Albumin: 3.5 g/dL (ref 3.5–5.2)
Alkaline Phosphatase: 197 U/L — ABNORMAL HIGH (ref 39–117)
BUN: 6 mg/dL (ref 6–23)
CO2: 23 meq/L (ref 19–32)
Calcium: 8.7 mg/dL (ref 8.4–10.5)
Chloride: 103 mEq/L (ref 96–112)
Creat: 0.53 mg/dL (ref 0.50–1.35)
GFR, Est African American: >89 mL/min
GFR, Est Non African American: >89 mL/min
Glucose, Bld: 87 mg/dL (ref 70–99)
Potassium: 3.9 mEq/L (ref 3.5–5.3)
SODIUM: 137 meq/L (ref 135–145)
TOTAL PROTEIN: 7.5 g/dL (ref 6.0–8.3)
Total Bilirubin: 0.6 mg/dL (ref 0.2–1.2)

## 2014-02-09 LAB — LIPID PANEL
CHOL/HDL RATIO: 1.9 ratio
Cholesterol: 144 mg/dL (ref 0–200)
HDL: 76 mg/dL (ref >39)
LDL Cholesterol: 17 mg/dL (ref 0–99)
Triglycerides: 256 mg/dL — ABNORMAL HIGH (ref <150)
VLDL: 51 mg/dL — ABNORMAL HIGH (ref 0–40)

## 2014-02-09 LAB — RPR

## 2014-02-16 ENCOUNTER — Other Ambulatory Visit: Payer: Self-pay | Admitting: Internal Medicine

## 2014-02-16 DIAGNOSIS — B20 Human immunodeficiency virus [HIV] disease: Secondary | ICD-10-CM

## 2014-03-09 ENCOUNTER — Ambulatory Visit: Payer: Commercial Managed Care - HMO | Admitting: Infectious Diseases

## 2014-03-09 ENCOUNTER — Telehealth: Payer: Self-pay | Admitting: *Deleted

## 2014-03-10 ENCOUNTER — Ambulatory Visit (INDEPENDENT_AMBULATORY_CARE_PROVIDER_SITE_OTHER): Payer: Medicare HMO | Admitting: Internal Medicine

## 2014-03-10 VITALS — BP 156/80 | HR 87 | Temp 97.8°F | Wt 134.0 lb

## 2014-03-10 DIAGNOSIS — B20 Human immunodeficiency virus [HIV] disease: Secondary | ICD-10-CM | POA: Diagnosis not present

## 2014-03-10 DIAGNOSIS — Z09 Encounter for follow-up examination after completed treatment for conditions other than malignant neoplasm: Secondary | ICD-10-CM

## 2014-03-10 DIAGNOSIS — B182 Chronic viral hepatitis C: Secondary | ICD-10-CM

## 2014-03-10 DIAGNOSIS — I10 Essential (primary) hypertension: Secondary | ICD-10-CM

## 2014-03-10 DIAGNOSIS — Z9289 Personal history of other medical treatment: Secondary | ICD-10-CM

## 2014-03-10 MED ORDER — ENSURE PO LIQD: 1.0000 | Freq: Two times a day (BID) | ORAL | Status: DC

## 2014-03-10 MED ORDER — DIAZEPAM 5 MG PO TABS: 5.0000 mg | ORAL_TABLET | Freq: Two times a day (BID) | ORAL | Status: DC | PRN

## 2014-03-10 NOTE — Telephone Encounter (Signed)
Patient missed his appointment.  When RN called to notify him, his wife stated he was sleeping and would call back some other time to reschedule.  RN asked if patient was to restart care at [facility] or if he was continuing at his PCP in [facility], patient's wife stated she didn't know. Landis Gandy, RN
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
## 2014-03-12 LAB — HIV-1 RNA ULTRAQUANT REFLEX TO GENTYP+: HIV 1 RNA Quant: <20 copies/mL (ref <20)

## 2014-03-15 ENCOUNTER — Ambulatory Visit: Payer: Commercial Managed Care - HMO | Admitting: Internal Medicine

## 2014-03-16 ENCOUNTER — Ambulatory Visit: Payer: Commercial Managed Care - HMO

## 2014-03-16 DIAGNOSIS — F1010 Alcohol abuse, uncomplicated: Secondary | ICD-10-CM

## 2014-04-19 ENCOUNTER — Ambulatory Visit: Payer: Commercial Managed Care - HMO

## 2014-04-19 ENCOUNTER — Ambulatory Visit: Payer: Commercial Managed Care - HMO | Admitting: Internal Medicine

## 2014-04-26 ENCOUNTER — Ambulatory Visit: Payer: Commercial Managed Care - HMO

## 2014-05-04 ENCOUNTER — Other Ambulatory Visit: Payer: Commercial Managed Care - HMO

## 2014-05-25 ENCOUNTER — Ambulatory Visit: Payer: Commercial Managed Care - HMO | Admitting: Internal Medicine

## 2014-05-31 ENCOUNTER — Other Ambulatory Visit: Payer: Commercial Managed Care - HMO

## 2014-06-14 ENCOUNTER — Ambulatory Visit: Payer: Commercial Managed Care - HMO | Admitting: Internal Medicine

## 2014-07-11 ENCOUNTER — Ambulatory Visit (INDEPENDENT_AMBULATORY_CARE_PROVIDER_SITE_OTHER): Payer: Medicare HMO | Admitting: Internal Medicine

## 2014-07-11 ENCOUNTER — Encounter: Payer: Self-pay | Admitting: Internal Medicine

## 2014-07-11 VITALS — BP 124/90 | HR 87 | Temp 98.1°F | Wt 137.0 lb

## 2014-07-11 DIAGNOSIS — M791 Myalgia: Secondary | ICD-10-CM

## 2014-07-11 DIAGNOSIS — Z23 Encounter for immunization: Secondary | ICD-10-CM

## 2014-07-11 DIAGNOSIS — B182 Chronic viral hepatitis C: Secondary | ICD-10-CM | POA: Diagnosis not present

## 2014-07-11 DIAGNOSIS — B20 Human immunodeficiency virus [HIV] disease: Secondary | ICD-10-CM | POA: Diagnosis not present

## 2014-07-11 DIAGNOSIS — M7918 Myalgia, other site: Secondary | ICD-10-CM

## 2014-07-11 DIAGNOSIS — Z79899 Other long term (current) drug therapy: Secondary | ICD-10-CM | POA: Diagnosis not present

## 2014-07-11 DIAGNOSIS — Z113 Encounter for screening for infections with a predominantly sexual mode of transmission: Secondary | ICD-10-CM

## 2014-07-11 LAB — CBC WITH DIFFERENTIAL/PLATELET
Basophils Absolute: 0.1 10*3/uL (ref 0.0–0.1)
Basophils Relative: 1 % (ref 0–1)
EOS ABS: 0.4 10*3/uL (ref 0.0–0.7)
EOS PCT: 5 % (ref 0–5)
HCT: 38.8 % — ABNORMAL LOW (ref 39.0–52.0)
Hemoglobin: 13.6 g/dL (ref 13.0–17.0)
Lymphocytes Relative: 16 % (ref 12–46)
Lymphs Abs: 1.4 10*3/uL (ref 0.7–4.0)
MCH: 33.2 pg (ref 26.0–34.0)
MCHC: 35.1 g/dL (ref 30.0–36.0)
MCV: 94.6 fL (ref 78.0–100.0)
MONO ABS: 0.6 10*3/uL (ref 0.1–1.0)
MPV: 12 fL (ref 8.6–12.4)
Monocytes Relative: 7 % (ref 3–12)
Neutro Abs: 6.2 10*3/uL (ref 1.7–7.7)
Neutrophils Relative %: 71 % (ref 43–77)
Platelets: 190 10*3/uL (ref 150–400)
RBC: 4.1 MIL/uL — ABNORMAL LOW (ref 4.22–5.81)
RDW: 13.8 % (ref 11.5–15.5)
WBC: 8.7 10*3/uL (ref 4.0–10.5)

## 2014-07-11 MED ORDER — METHOCARBAMOL 500 MG PO TABS: 500.0000 mg | ORAL_TABLET | Freq: Three times a day (TID) | ORAL | Status: DC | PRN
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
## 2014-07-12 LAB — COMPLETE METABOLIC PANEL WITH GFR
ALK PHOS: 204 U/L — AB (ref 39–117)
ALT: 29 U/L (ref 0–53)
AST: 55 U/L — AB (ref 0–37)
Albumin: 3.6 g/dL (ref 3.5–5.2)
BUN: 10 mg/dL (ref 6–23)
CALCIUM: 9.1 mg/dL (ref 8.4–10.5)
CHLORIDE: 100 meq/L (ref 96–112)
CO2: 22 mEq/L (ref 19–32)
Creat: 0.61 mg/dL (ref 0.50–1.35)
GFR, Est African American: >89 mL/min
GFR, Est Non African American: >89 mL/min
Glucose, Bld: 121 mg/dL — ABNORMAL HIGH (ref 70–99)
POTASSIUM: 3.4 meq/L — AB (ref 3.5–5.3)
Sodium: 136 mEq/L (ref 135–145)
TOTAL PROTEIN: 8.1 g/dL (ref 6.0–8.3)
Total Bilirubin: 0.5 mg/dL (ref 0.2–1.2)

## 2014-07-12 LAB — HEPATITIS C RNA QUANTITATIVE
HCV QUANT: 1184836 [IU]/mL — AB (ref <15)
HCV Quantitative Log: 6.07 {Log} — ABNORMAL HIGH (ref <1.18)

## 2014-07-12 LAB — LIPID PANEL
CHOLESTEROL: 163 mg/dL (ref 0–200)
HDL: 89 mg/dL (ref >=40)
LDL CALC: 53 mg/dL (ref 0–99)
TRIGLYCERIDES: 105 mg/dL (ref <150)
Total CHOL/HDL Ratio: 1.8 Ratio
VLDL: 21 mg/dL (ref 0–40)

## 2014-07-12 LAB — RPR

## 2014-07-13 LAB — T-HELPER CELL (CD4) - (RCID CLINIC ONLY)
CD4 T CELL ABS: 570 /uL (ref 400–2700)
CD4 T CELL HELPER: 38 % (ref 33–55)

## 2014-07-14 LAB — HEPATITIS C GENOTYPE

## 2014-07-14 LAB — HIV-1 RNA QUANT-NO REFLEX-BLD
HIV 1 RNA Quant: <20 copies/mL (ref <20)
HIV-1 RNA Quant, Log: <1.3 {Log} (ref <1.30)

## 2014-08-18 ENCOUNTER — Ambulatory Visit: Payer: Medicare HMO | Admitting: Internal Medicine

## 2014-09-15 ENCOUNTER — Other Ambulatory Visit: Payer: Self-pay | Admitting: Internal Medicine

## 2014-10-25 ENCOUNTER — Other Ambulatory Visit: Payer: Self-pay | Admitting: Internal Medicine

## 2014-10-25 ENCOUNTER — Other Ambulatory Visit (HOSPITAL_COMMUNITY)
Admission: RE | Admit: 2014-10-25 | Discharge: 2014-10-25 | Disposition: A | Discharge status: Home / Self Care | Payer: Medicare HMO | Source: Ambulatory Visit | Attending: Internal Medicine | Admitting: Internal Medicine

## 2014-10-25 ENCOUNTER — Other Ambulatory Visit: Payer: Medicare HMO

## 2014-10-25 ENCOUNTER — Telehealth: Payer: Self-pay | Admitting: *Deleted

## 2014-10-25 DIAGNOSIS — Z113 Encounter for screening for infections with a predominantly sexual mode of transmission: Secondary | ICD-10-CM

## 2014-10-25 DIAGNOSIS — B20 Human immunodeficiency virus [HIV] disease: Secondary | ICD-10-CM

## 2014-10-25 LAB — COMPLETE METABOLIC PANEL WITH GFR
ALT: 13 U/L (ref 9–46)
AST: 38 U/L — AB (ref 10–35)
Albumin: 3.5 g/dL — ABNORMAL LOW (ref 3.6–5.1)
Alkaline Phosphatase: 205 U/L — ABNORMAL HIGH (ref 40–115)
BUN: 4 mg/dL — AB (ref 7–25)
CALCIUM: 9.4 mg/dL (ref 8.6–10.3)
CHLORIDE: 98 mmol/L (ref 98–110)
CO2: 23 mmol/L (ref 20–31)
CREATININE: 0.51 mg/dL — AB (ref 0.70–1.25)
GFR, Est Non African American: >89 mL/min (ref >=60)
Glucose, Bld: 99 mg/dL (ref 65–99)
POTASSIUM: 4.2 mmol/L (ref 3.5–5.3)
Sodium: 135 mmol/L (ref 135–146)
Total Bilirubin: 0.5 mg/dL (ref 0.2–1.2)
Total Protein: 8.3 g/dL — ABNORMAL HIGH (ref 6.1–8.1)

## 2014-10-25 LAB — CBC WITH DIFFERENTIAL/PLATELET
BASOS ABS: 0.1 10*3/uL (ref 0.0–0.1)
Basophils Relative: 1 % (ref 0–1)
Eosinophils Absolute: 0.6 10*3/uL (ref 0.0–0.7)
Eosinophils Relative: 6 % — ABNORMAL HIGH (ref 0–5)
HEMATOCRIT: 36.4 % — AB (ref 39.0–52.0)
HEMOGLOBIN: 12.4 g/dL — AB (ref 13.0–17.0)
LYMPHS ABS: 1.2 10*3/uL (ref 0.7–4.0)
LYMPHS PCT: 12 % (ref 12–46)
MCH: 32.5 pg (ref 26.0–34.0)
MCHC: 34.1 g/dL (ref 30.0–36.0)
MCV: 95.5 fL (ref 78.0–100.0)
MPV: 10.5 fL (ref 8.6–12.4)
Monocytes Absolute: 0.9 10*3/uL (ref 0.1–1.0)
Monocytes Relative: 9 % (ref 3–12)
NEUTROS ABS: 7.1 10*3/uL (ref 1.7–7.7)
Neutrophils Relative %: 72 % (ref 43–77)
Platelets: 437 10*3/uL — ABNORMAL HIGH (ref 150–400)
RBC: 3.81 MIL/uL — AB (ref 4.22–5.81)
RDW: 13 % (ref 11.5–15.5)
WBC: 9.9 10*3/uL (ref 4.0–10.5)

## 2014-10-25 NOTE — Telephone Encounter (Addendum)
Patient walked in asking for advice. States he was recently at [facility], was told to stop his medications but could not elaborate as to why. He comes in today asking if he should restart, as he would need refills.  Patient informed that the physician would have to give that advice, not a nurse.  Patient is overdue for an office visit, is non compliant with labs and visits.  Patient was given a record release request to sign to be sent to [place] for his notes. Faxed release to [facility]. Patient was advised to have labs drawn today and schedule follow up with Dr. Baxter Flattery.  He was advised that Dr.  Baxter Flattery would be asked regarding when to restart his medication. Please advise if the patient should restart his Atripla before being seen in office. Landis Gandy, RN

## 2014-10-26 LAB — T-HELPER CELL (CD4) - (RCID CLINIC ONLY)
CD4 T CELL HELPER: 31 % — AB (ref 33–55)
CD4 T Cell Abs: 410 /uL (ref 400–2700)

## 2014-10-26 LAB — HIV-1 RNA QUANT-NO REFLEX-BLD

## 2014-10-26 LAB — URINE CYTOLOGY ANCILLARY ONLY
CHLAMYDIA, DNA PROBE: NEGATIVE
Neisseria Gonorrhea: NEGATIVE

## 2014-10-27 ENCOUNTER — Other Ambulatory Visit: Payer: Self-pay | Admitting: *Deleted

## 2014-10-27 DIAGNOSIS — B20 Human immunodeficiency virus [HIV] disease: Secondary | ICD-10-CM

## 2014-10-27 MED ORDER — EFAVIRENZ-EMTRICITAB-TENOFOVIR 600-200-300 MG PO TABS: 1.0000 | ORAL_TABLET | Freq: Every day | ORAL | Status: DC

## 2014-10-27 NOTE — Telephone Encounter (Signed)
Patient's viral load <20, discharge paperwork received from [place] and it states for patient to continue his atripla, placed in Dr. Storm Frisk box.  Per Dr Baxter Flattery, ok to fill atripla. rx sent.

## 2014-11-08 ENCOUNTER — Ambulatory Visit: Payer: Medicare HMO | Admitting: Internal Medicine

## 2014-11-17 ENCOUNTER — Other Ambulatory Visit: Payer: Self-pay | Admitting: Internal Medicine

## 2015-01-03 ENCOUNTER — Ambulatory Visit (INDEPENDENT_AMBULATORY_CARE_PROVIDER_SITE_OTHER): Payer: Medicare HMO | Admitting: Internal Medicine

## 2015-01-03 ENCOUNTER — Other Ambulatory Visit: Payer: Self-pay | Admitting: *Deleted

## 2015-01-03 ENCOUNTER — Encounter: Payer: Self-pay | Admitting: Internal Medicine

## 2015-01-03 VITALS — BP 155/84 | HR 94 | Temp 98.3°F | Ht 64.0 in | Wt 124.0 lb

## 2015-01-03 DIAGNOSIS — B20 Human immunodeficiency virus [HIV] disease: Secondary | ICD-10-CM

## 2015-01-03 DIAGNOSIS — B182 Chronic viral hepatitis C: Secondary | ICD-10-CM | POA: Diagnosis not present

## 2015-01-03 DIAGNOSIS — G4700 Insomnia, unspecified: Secondary | ICD-10-CM | POA: Diagnosis not present

## 2015-01-03 DIAGNOSIS — F1020 Alcohol dependence, uncomplicated: Secondary | ICD-10-CM

## 2015-01-03 MED ORDER — ENSURE PO LIQD: 237.0000 mL | Freq: Two times a day (BID) | ORAL | Status: DC

## 2015-01-03 MED ORDER — ELVITEG-COBIC-EMTRICIT-TENOFAF 150-150-200-10 MG PO TABS: 1.0000 | ORAL_TABLET | Freq: Every day | ORAL | Status: DC

## 2015-01-03 MED ORDER — TRAZODONE HCL 50 MG PO TABS: 50.0000 mg | ORAL_TABLET | Freq: Every day | ORAL | Status: AC
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
## 2015-01-06 LAB — LIVER FIBROSIS, FIBROTEST-ACTITEST
ALT: 55 U/L — ABNORMAL HIGH (ref 9–46)
Alpha-2-Macroglobulin: 328 mg/dL — ABNORMAL HIGH (ref 106–279)
Apolipoprotein A1: 229 mg/dL — ABNORMAL HIGH (ref 94–176)
BILIRUBIN: 0.3 mg/dL (ref 0.2–1.2)
Fibrosis Score: 0.53
GGT: 1224 U/L — ABNORMAL HIGH (ref 3–70)
HAPTOGLOBIN: 195 mg/dL (ref 43–212)
Necroinflammat ACT Score: 0.41
REFERENCE ID: 1356711

## 2015-01-09 ENCOUNTER — Other Ambulatory Visit: Payer: Self-pay | Admitting: Pharmacist Clinician (PhC)/ Clinical Pharmacy Specialist

## 2015-01-09 MED ORDER — LEDIPASVIR-SOFOSBUVIR 90-400 MG PO TABS
1.0000 | ORAL_TABLET | Freq: Every day | ORAL | Status: DC
Start: 2015-01-09 — End: 2015-09-26

## 2015-01-16 ENCOUNTER — Telehealth: Payer: Self-pay | Admitting: Pharmacy Technician

## 2015-01-17 ENCOUNTER — Encounter: Payer: Self-pay | Admitting: Pharmacy Technician

## 2015-01-18 ENCOUNTER — Encounter: Payer: Self-pay | Admitting: Pharmacy Technician

## 2015-02-07 ENCOUNTER — Ambulatory Visit: Payer: Medicare HMO

## 2015-03-20 ENCOUNTER — Telehealth: Payer: Self-pay | Admitting: *Deleted

## 2015-03-20 NOTE — Telephone Encounter (Signed)
Pt's Genvoya approved through 06/15/15, [employer] Ins.  Pt has already been called by the pharmacy to pick up rx.

## 2015-03-22 ENCOUNTER — Other Ambulatory Visit: Payer: Medicare HMO

## 2015-04-13 ENCOUNTER — Ambulatory Visit: Payer: Medicare HMO | Admitting: Internal Medicine

## 2015-04-18 NOTE — Telephone Encounter (Signed)
Have called 3 times to let know Hep C med is at Outpatient pharmacy to pick up.  First time was 01/10/15 and person answering stated she would let him know.

## 2015-08-17 ENCOUNTER — Other Ambulatory Visit: Payer: Medicare HMO

## 2015-08-17 ENCOUNTER — Other Ambulatory Visit (HOSPITAL_COMMUNITY)
Admission: RE | Admit: 2015-08-17 | Discharge: 2015-08-17 | Disposition: A | Discharge status: Home / Self Care | Payer: Medicare HMO | Source: Ambulatory Visit | Attending: Internal Medicine | Admitting: Internal Medicine

## 2015-08-17 DIAGNOSIS — B20 Human immunodeficiency virus [HIV] disease: Secondary | ICD-10-CM

## 2015-08-17 DIAGNOSIS — Z113 Encounter for screening for infections with a predominantly sexual mode of transmission: Secondary | ICD-10-CM | POA: Diagnosis present

## 2015-08-17 DIAGNOSIS — Z79899 Other long term (current) drug therapy: Secondary | ICD-10-CM

## 2015-08-17 DIAGNOSIS — Z21 Asymptomatic human immunodeficiency virus [HIV] infection status: Secondary | ICD-10-CM

## 2015-08-17 LAB — CBC WITH DIFFERENTIAL/PLATELET
BASOS PCT: 1 %
Basophils Absolute: 66 cells/uL (ref 0–200)
EOS ABS: 132 {cells}/uL (ref 15–500)
Eosinophils Relative: 2 %
HCT: 35.9 % — ABNORMAL LOW (ref 38.5–50.0)
Hemoglobin: 12 g/dL — ABNORMAL LOW (ref 13.2–17.1)
LYMPHS PCT: 25 %
Lymphs Abs: 1650 cells/uL (ref 850–3900)
MCH: 31.9 pg (ref 27.0–33.0)
MCHC: 33.4 g/dL (ref 32.0–36.0)
MCV: 95.5 fL (ref 80.0–100.0)
MONOS PCT: 7 %
MPV: 11.4 fL (ref 7.5–12.5)
Monocytes Absolute: 462 cells/uL (ref 200–950)
Neutro Abs: 4290 cells/uL (ref 1500–7800)
Neutrophils Relative %: 65 %
PLATELETS: 188 10*3/uL (ref 140–400)
RBC: 3.76 MIL/uL — ABNORMAL LOW (ref 4.20–5.80)
RDW: 17.3 % — AB (ref 11.0–15.0)
WBC: 6.6 10*3/uL (ref 3.8–10.8)

## 2015-08-18 LAB — COMPLETE METABOLIC PANEL WITH GFR
ALBUMIN: 4 g/dL (ref 3.6–5.1)
ALK PHOS: 86 U/L (ref 40–115)
ALT: 20 U/L (ref 9–46)
AST: 35 U/L (ref 10–35)
BILIRUBIN TOTAL: 0.3 mg/dL (ref 0.2–1.2)
BUN: 11 mg/dL (ref 7–25)
CALCIUM: 9.2 mg/dL (ref 8.6–10.3)
CO2: 19 mmol/L — AB (ref 20–31)
CREATININE: 0.81 mg/dL (ref 0.70–1.25)
Chloride: 109 mmol/L (ref 98–110)
GFR, Est Non African American: >89 mL/min (ref >=60)
Glucose, Bld: 103 mg/dL — ABNORMAL HIGH (ref 65–99)
Potassium: 3.6 mmol/L (ref 3.5–5.3)
Sodium: 136 mmol/L (ref 135–146)
TOTAL PROTEIN: 7.9 g/dL (ref 6.1–8.1)

## 2015-08-18 LAB — URINE CYTOLOGY ANCILLARY ONLY
Chlamydia: NEGATIVE
Neisseria Gonorrhea: NEGATIVE

## 2015-08-18 LAB — LIPID PANEL
CHOLESTEROL: 189 mg/dL (ref 125–200)
HDL: 80 mg/dL (ref >=40)
LDL Cholesterol: 46 mg/dL (ref <130)
TRIGLYCERIDES: 316 mg/dL — AB (ref <150)
Total CHOL/HDL Ratio: 2.4 Ratio (ref <=5.0)
VLDL: 63 mg/dL — ABNORMAL HIGH (ref <30)

## 2015-08-18 LAB — HIV-1 RNA QUANT-NO REFLEX-BLD: HIV 1 RNA Quant: <20 copies/mL (ref <20)

## 2015-08-18 LAB — T-HELPER CELL (CD4) - (RCID CLINIC ONLY)
CD4 % Helper T Cell: 38 % (ref 33–55)
CD4 T CELL ABS: 620 /uL (ref 400–2700)

## 2015-08-18 LAB — RPR

## 2015-08-26 LAB — HLA B*5701: HLA-B*5701 w/rflx HLA-B High: NEGATIVE

## 2015-09-26 ENCOUNTER — Encounter: Payer: Self-pay | Admitting: Internal Medicine

## 2015-09-26 ENCOUNTER — Other Ambulatory Visit: Payer: Self-pay

## 2015-09-26 ENCOUNTER — Ambulatory Visit (INDEPENDENT_AMBULATORY_CARE_PROVIDER_SITE_OTHER): Payer: Medicare HMO | Admitting: Internal Medicine

## 2015-09-26 ENCOUNTER — Ambulatory Visit: Payer: Medicare HMO | Admitting: *Deleted

## 2015-09-26 VITALS — BP 190/92 | HR 62 | Temp 98.3°F | Wt 137.0 lb

## 2015-09-26 DIAGNOSIS — R030 Elevated blood-pressure reading, without diagnosis of hypertension: Secondary | ICD-10-CM

## 2015-09-26 DIAGNOSIS — F1020 Alcohol dependence, uncomplicated: Secondary | ICD-10-CM

## 2015-09-26 DIAGNOSIS — B182 Chronic viral hepatitis C: Secondary | ICD-10-CM

## 2015-09-26 DIAGNOSIS — IMO0001 Reserved for inherently not codable concepts without codable children: Secondary | ICD-10-CM

## 2015-09-26 MED ORDER — CLONIDINE HCL 0.1 MG PO TABS
0.1000 mg | ORAL_TABLET | Freq: Once | ORAL | Status: AC
  Administered 2015-09-26: 0.1 mg via ORAL

## 2015-09-26 MED ORDER — LISINOPRIL 20 MG PO TABS: 20.0000 mg | ORAL_TABLET | Freq: Every day | ORAL | 11 refills | Status: DC

## 2015-09-26 MED FILL — LISINOPRIL 20 MG TABLET: 20 | 30 days supply | Qty: 30 | Fill #0
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
## 2015-09-27 NOTE — BH Specialist Note (Signed)
Counselor met with George Allen today in the exam room for a warm handoff.  Patient was oriented times four with good affect and dress.  Patient stated he was sure how things were going necessarily.  Patient shared that he drinks about 2-3 drinks of liquor a night. Counselor educated patient about his drinking and how it could affect his health and the medications he takes for his medical issues.  Counselor directly asked patient if he thought he had a drinking problem and he said no but I do like to have my drinks in the evening.  Counselor feels that patient is underreporting and was encouraged to seek counseling to learn ways to deal with life's adversities besides using alcohol. Patient said ok and would make an appointment when he checked out today.   Rolena Infante, MA,LPC [facility]

## 2015-09-29 LAB — HEPATITIS C RNA QUANTITATIVE: HCV QUANT: NOT DETECTED [IU]/mL (ref <15)

## 2015-10-02 ENCOUNTER — Ambulatory Visit: Payer: Medicare HMO

## 2015-10-02 VITALS — BP 152/82

## 2015-10-02 DIAGNOSIS — IMO0001 Reserved for inherently not codable concepts without codable children: Secondary | ICD-10-CM

## 2015-10-02 DIAGNOSIS — R030 Elevated blood-pressure reading, without diagnosis of hypertension: Principal | ICD-10-CM

## 2015-10-02 NOTE — Progress Notes (Signed)
Patient in for BP check. 152/82. No complaints of headaches. Stated taking medication every day since office visit. Patient stated understanding the importance of taking blood pressure medication. George Allen

## 2015-11-02 ENCOUNTER — Telehealth: Payer: Self-pay | Admitting: *Deleted

## 2015-11-02 NOTE — Telephone Encounter (Signed)
Nurse at [facility] called to get letter stating the patient is ok for dental work. Advised her to fax [place] a form and I will get the doctor to sign it. She advised they do not have a form just need the doctor to provide a letter. Advised her we will also need a release of information from the patient before we can provide a letter. She advised she will call the patient and try to hav him sign one. Advised her once we get it I will ask the doctor to write a letter and fax it to [phone].

## 2015-12-27 ENCOUNTER — Ambulatory Visit: Payer: Medicare HMO | Admitting: Infectious Disease

## 2016-01-03 ENCOUNTER — Ambulatory Visit: Payer: Medicare HMO | Admitting: Internal Medicine

## 2016-02-07 ENCOUNTER — Other Ambulatory Visit: Payer: Self-pay | Admitting: Internal Medicine

## 2016-02-07 DIAGNOSIS — B20 Human immunodeficiency virus [HIV] disease: Secondary | ICD-10-CM

## 2016-03-28 ENCOUNTER — Other Ambulatory Visit: Payer: Self-pay | Admitting: *Deleted

## 2016-03-28 MED ORDER — ENSURE PO LIQD: 237.0000 mL | Freq: Two times a day (BID) | ORAL | 3 refills | Status: DC

## 2016-04-09 ENCOUNTER — Ambulatory Visit (INDEPENDENT_AMBULATORY_CARE_PROVIDER_SITE_OTHER): Payer: Medicare HMO | Admitting: Internal Medicine

## 2016-04-09 ENCOUNTER — Encounter: Payer: Self-pay | Admitting: Internal Medicine

## 2016-04-09 VITALS — BP 169/93 | HR 86 | Temp 97.7°F | Ht 63.0 in | Wt 147.0 lb

## 2016-04-09 DIAGNOSIS — B20 Human immunodeficiency virus [HIV] disease: Secondary | ICD-10-CM

## 2016-04-09 DIAGNOSIS — B182 Chronic viral hepatitis C: Secondary | ICD-10-CM

## 2016-04-09 LAB — CBC WITH DIFFERENTIAL/PLATELET
BASOS ABS: 59 {cells}/uL (ref 0–200)
Basophils Relative: 1 %
EOS PCT: 5 %
Eosinophils Absolute: 295 cells/uL (ref 15–500)
HCT: 38.3 % — ABNORMAL LOW (ref 38.5–50.0)
Hemoglobin: 12.8 g/dL — ABNORMAL LOW (ref 13.2–17.1)
LYMPHS PCT: 30 %
Lymphs Abs: 1770 cells/uL (ref 850–3900)
MCH: 31.3 pg (ref 27.0–33.0)
MCHC: 33.4 g/dL (ref 32.0–36.0)
MCV: 93.6 fL (ref 80.0–100.0)
MONOS PCT: 8 %
MPV: 11.7 fL (ref 7.5–12.5)
Monocytes Absolute: 472 cells/uL (ref 200–950)
NEUTROS PCT: 56 %
Neutro Abs: 3304 cells/uL (ref 1500–7800)
PLATELETS: 162 10*3/uL (ref 140–400)
RBC: 4.09 MIL/uL — AB (ref 4.20–5.80)
RDW: 17.1 % — AB (ref 11.0–15.0)
WBC: 5.9 10*3/uL (ref 3.8–10.8)

## 2016-04-09 LAB — COMPLETE METABOLIC PANEL WITH GFR
ALBUMIN: 4 g/dL (ref 3.6–5.1)
ALK PHOS: 78 U/L (ref 40–115)
ALT: 33 U/L (ref 9–46)
AST: 61 U/L — ABNORMAL HIGH (ref 10–35)
BUN: 7 mg/dL (ref 7–25)
CO2: 22 mmol/L (ref 20–31)
Calcium: 8.8 mg/dL (ref 8.6–10.3)
Chloride: 104 mmol/L (ref 98–110)
Creat: 0.99 mg/dL (ref 0.70–1.25)
GFR, EST NON AFRICAN AMERICAN: 80 mL/min (ref >=60)
GFR, Est African American: >89 mL/min (ref >=60)
GLUCOSE: 113 mg/dL — AB (ref 65–99)
Potassium: 3.3 mmol/L — ABNORMAL LOW (ref 3.5–5.3)
SODIUM: 138 mmol/L (ref 135–146)
Total Bilirubin: 0.7 mg/dL (ref 0.2–1.2)
Total Protein: 7.8 g/dL (ref 6.1–8.1)

## 2016-04-09 MED ORDER — LISINOPRIL 20 MG PO TABS: 20.0000 mg | ORAL_TABLET | Freq: Every day | ORAL | 11 refills | Status: DC

## 2016-04-09 MED ORDER — LISINOPRIL 20 MG PO TABS: 20.0000 mg | ORAL_TABLET | Freq: Every day | ORAL | 11 refills | Status: AC
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
## 2016-04-10 LAB — RPR

## 2016-04-10 LAB — T-HELPER CELL (CD4) - (RCID CLINIC ONLY)
CD4 % Helper T Cell: 35 % (ref 33–55)
CD4 T CELL ABS: 620 /uL (ref 400–2700)

## 2016-04-11 ENCOUNTER — Telehealth: Payer: Self-pay | Admitting: *Deleted

## 2016-04-11 NOTE — Telephone Encounter (Signed)
[facility] called to get contact information for patient. They want to reschedule him, cannot reach radiology.   Landis Gandy, RN

## 2016-04-12 ENCOUNTER — Ambulatory Visit (HOSPITAL_BASED_OUTPATIENT_CLINIC_OR_DEPARTMENT_OTHER): Admission: RE | Admit: 2016-04-12 | Payer: Medicare HMO | Source: Ambulatory Visit

## 2016-04-12 LAB — HIV-1 RNA QUANT-NO REFLEX-BLD
HIV 1 RNA Quant: 28 copies/mL — ABNORMAL HIGH
HIV-1 RNA Quant, Log: 1.45 Log copies/mL — ABNORMAL HIGH

## 2016-05-29 ENCOUNTER — Other Ambulatory Visit: Payer: Self-pay | Admitting: Internal Medicine

## 2016-05-29 DIAGNOSIS — B20 Human immunodeficiency virus [HIV] disease: Secondary | ICD-10-CM

## 2016-06-17 ENCOUNTER — Telehealth: Payer: Self-pay | Admitting: Lab

## 2016-06-17 NOTE — Telephone Encounter (Signed)
Patient had an appointment on 04/23/16 but it was cancelled.  I tried to reach the patient on 03/20 and today.  The telephone number is still disconnected

## 2016-07-08 ENCOUNTER — Ambulatory Visit: Payer: Medicare HMO | Admitting: Internal Medicine

## 2016-07-09 ENCOUNTER — Ambulatory Visit: Payer: Medicare HMO | Admitting: Internal Medicine

## 2016-10-28 ENCOUNTER — Other Ambulatory Visit: Payer: Medicare HMO

## 2016-10-28 ENCOUNTER — Other Ambulatory Visit: Payer: Self-pay | Admitting: *Deleted

## 2016-10-28 DIAGNOSIS — Z113 Encounter for screening for infections with a predominantly sexual mode of transmission: Secondary | ICD-10-CM

## 2016-10-28 DIAGNOSIS — B20 Human immunodeficiency virus [HIV] disease: Secondary | ICD-10-CM

## 2016-10-28 LAB — CBC WITH DIFFERENTIAL/PLATELET
BASOS ABS: 51 {cells}/uL (ref 0–200)
Basophils Relative: 1 %
EOS PCT: 2 %
Eosinophils Absolute: 102 cells/uL (ref 15–500)
HCT: 40 % (ref 38.5–50.0)
Hemoglobin: 13.5 g/dL (ref 13.2–17.1)
LYMPHS ABS: 1530 {cells}/uL (ref 850–3900)
LYMPHS PCT: 30 %
MCH: 32.3 pg (ref 27.0–33.0)
MCHC: 33.8 g/dL (ref 32.0–36.0)
MCV: 95.7 fL (ref 80.0–100.0)
MONOS PCT: 8 %
MPV: 10.6 fL (ref 7.5–12.5)
Monocytes Absolute: 408 cells/uL (ref 200–950)
NEUTROS ABS: 3009 {cells}/uL (ref 1500–7800)
NEUTROS PCT: 59 %
PLATELETS: 255 10*3/uL (ref 140–400)
RBC: 4.18 MIL/uL — AB (ref 4.20–5.80)
RDW: 15.5 % — AB (ref 11.0–15.0)
WBC: 5.1 10*3/uL (ref 3.8–10.8)

## 2016-10-29 LAB — COMPLETE METABOLIC PANEL WITH GFR
ALBUMIN: 4.3 g/dL (ref 3.6–5.1)
ALK PHOS: 125 U/L — AB (ref 40–115)
ALT: 52 U/L — ABNORMAL HIGH (ref 9–46)
AST: 119 U/L — AB (ref 10–35)
BUN: 6 mg/dL — AB (ref 7–25)
CHLORIDE: 102 mmol/L (ref 98–110)
CO2: 16 mmol/L — ABNORMAL LOW (ref 20–32)
Calcium: 9.1 mg/dL (ref 8.6–10.3)
Creat: 0.98 mg/dL (ref 0.70–1.25)
GFR, EST NON AFRICAN AMERICAN: 81 mL/min (ref >=60)
GFR, Est African American: >89 mL/min (ref >=60)
Glucose, Bld: 126 mg/dL — ABNORMAL HIGH (ref 65–99)
POTASSIUM: 3.3 mmol/L — AB (ref 3.5–5.3)
SODIUM: 136 mmol/L (ref 135–146)
Total Bilirubin: 0.5 mg/dL (ref 0.2–1.2)
Total Protein: 8.1 g/dL (ref 6.1–8.1)

## 2016-10-29 LAB — T-HELPER CELL (CD4) - (RCID CLINIC ONLY)
CD4 T CELL HELPER: 41 % (ref 33–55)
CD4 T Cell Abs: 660 /uL (ref 400–2700)

## 2016-10-31 LAB — HIV-1 RNA QUANT-NO REFLEX-BLD
HIV 1 RNA Quant: <20 copies/mL — AB
HIV-1 RNA QUANT, LOG: DETECTED {Log_copies}/mL — AB

## 2016-11-14 ENCOUNTER — Encounter: Payer: Self-pay | Admitting: Internal Medicine

## 2016-11-14 ENCOUNTER — Ambulatory Visit (INDEPENDENT_AMBULATORY_CARE_PROVIDER_SITE_OTHER): Payer: Medicare HMO | Admitting: Internal Medicine

## 2016-11-14 VITALS — BP 145/84 | HR 76 | Temp 98.4°F | Wt 141.0 lb

## 2016-11-14 DIAGNOSIS — S2242XD Multiple fractures of ribs, left side, subsequent encounter for fracture with routine healing: Secondary | ICD-10-CM | POA: Diagnosis not present

## 2016-11-14 DIAGNOSIS — Z23 Encounter for immunization: Secondary | ICD-10-CM | POA: Diagnosis not present

## 2016-11-14 DIAGNOSIS — B20 Human immunodeficiency virus [HIV] disease: Secondary | ICD-10-CM | POA: Diagnosis not present
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
## 2016-12-04 ENCOUNTER — Other Ambulatory Visit: Payer: Self-pay | Admitting: *Deleted

## 2016-12-04 ENCOUNTER — Telehealth: Payer: Self-pay | Admitting: *Deleted

## 2016-12-04 MED ORDER — ENSURE PO LIQD: 237.0000 mL | Freq: Two times a day (BID) | ORAL | 5 refills | Status: DC

## 2016-12-04 NOTE — Telephone Encounter (Signed)
Patient walked into clinic stating that he spoke to Dr. Baxter Flattery at last office visit and she said it was ok for him to have an Rx for Ensure for six months only. Patient recently discharged from hospital. Rx faxed to [facility] in [facility] and confirmation received.

## 2017-02-27 ENCOUNTER — Other Ambulatory Visit: Payer: Self-pay | Admitting: Internal Medicine

## 2017-02-27 DIAGNOSIS — B20 Human immunodeficiency virus [HIV] disease: Secondary | ICD-10-CM

## 2017-04-30 ENCOUNTER — Other Ambulatory Visit: Payer: Self-pay | Admitting: Behavioral Health

## 2017-04-30 ENCOUNTER — Other Ambulatory Visit: Payer: Medicare HMO

## 2017-04-30 DIAGNOSIS — Z79899 Other long term (current) drug therapy: Secondary | ICD-10-CM

## 2017-04-30 DIAGNOSIS — B20 Human immunodeficiency virus [HIV] disease: Secondary | ICD-10-CM

## 2017-04-30 DIAGNOSIS — Z113 Encounter for screening for infections with a predominantly sexual mode of transmission: Secondary | ICD-10-CM

## 2017-05-06 ENCOUNTER — Other Ambulatory Visit: Payer: Medicare HMO

## 2017-05-06 DIAGNOSIS — Z113 Encounter for screening for infections with a predominantly sexual mode of transmission: Secondary | ICD-10-CM

## 2017-05-06 DIAGNOSIS — B20 Human immunodeficiency virus [HIV] disease: Secondary | ICD-10-CM

## 2017-05-06 DIAGNOSIS — Z79899 Other long term (current) drug therapy: Secondary | ICD-10-CM

## 2017-05-07 LAB — CBC WITH DIFFERENTIAL/PLATELET
Basophils Absolute: 58 cells/uL (ref 0–200)
Basophils Relative: 1 %
EOS PCT: 2.8 %
Eosinophils Absolute: 162 cells/uL (ref 15–500)
HEMATOCRIT: 36.2 % — AB (ref 38.5–50.0)
HEMOGLOBIN: 12.4 g/dL — AB (ref 13.2–17.1)
LYMPHS ABS: 2053 {cells}/uL (ref 850–3900)
MCH: 31.7 pg (ref 27.0–33.0)
MCHC: 34.3 g/dL (ref 32.0–36.0)
MCV: 92.6 fL (ref 80.0–100.0)
MONOS PCT: 8.3 %
MPV: 11.7 fL (ref 7.5–12.5)
NEUTROS ABS: 3045 {cells}/uL (ref 1500–7800)
NEUTROS PCT: 52.5 %
Platelets: 183 10*3/uL (ref 140–400)
RBC: 3.91 10*6/uL — AB (ref 4.20–5.80)
RDW: 16.3 % — AB (ref 11.0–15.0)
Total Lymphocyte: 35.4 %
WBC mixed population: 481 cells/uL (ref 200–950)
WBC: 5.8 10*3/uL (ref 3.8–10.8)

## 2017-05-07 LAB — COMPREHENSIVE METABOLIC PANEL
AG RATIO: 1.3 (calc) (ref 1.0–2.5)
ALBUMIN MSPROF: 4.8 g/dL (ref 3.6–5.1)
ALT: 59 U/L — ABNORMAL HIGH (ref 9–46)
AST: 161 U/L — ABNORMAL HIGH (ref 10–35)
Alkaline phosphatase (APISO): 127 U/L — ABNORMAL HIGH (ref 40–115)
BILIRUBIN TOTAL: 0.4 mg/dL (ref 0.2–1.2)
BUN: 10 mg/dL (ref 7–25)
CALCIUM: 9.6 mg/dL (ref 8.6–10.3)
CO2: 25 mmol/L (ref 20–32)
Chloride: 99 mmol/L (ref 98–110)
Creat: 0.81 mg/dL (ref 0.70–1.25)
Globulin: 3.7 g/dL (calc) (ref 1.9–3.7)
Glucose, Bld: 144 mg/dL — ABNORMAL HIGH (ref 65–99)
POTASSIUM: 3.3 mmol/L — AB (ref 3.5–5.3)
SODIUM: 136 mmol/L (ref 135–146)
TOTAL PROTEIN: 8.5 g/dL — AB (ref 6.1–8.1)

## 2017-05-07 LAB — LIPID PANEL
CHOLESTEROL: 185 mg/dL (ref <200)
HDL: 124 mg/dL (ref >40)
Non-HDL Cholesterol (Calc): 61 mg/dL (calc) (ref <130)
Total CHOL/HDL Ratio: 1.5 (calc) (ref <5.0)
Triglycerides: 733 mg/dL — ABNORMAL HIGH (ref <150)

## 2017-05-07 LAB — RPR: RPR Ser Ql: NONREACTIVE

## 2017-05-08 LAB — T-HELPER CELL (CD4) - (RCID CLINIC ONLY)
CD4 T CELL ABS: 900 /uL (ref 400–2700)
CD4 T CELL HELPER: 42 % (ref 33–55)

## 2017-05-08 LAB — HIV-1 RNA QUANT-NO REFLEX-BLD
HIV 1 RNA Quant: <20 copies/mL — AB
HIV-1 RNA Quant, Log: <1.3 Log copies/mL — AB

## 2017-05-14 ENCOUNTER — Other Ambulatory Visit: Payer: Self-pay | Admitting: *Deleted

## 2017-05-14 ENCOUNTER — Encounter: Payer: Self-pay | Admitting: Internal Medicine

## 2017-05-14 ENCOUNTER — Other Ambulatory Visit: Payer: Self-pay

## 2017-05-14 ENCOUNTER — Ambulatory Visit (INDEPENDENT_AMBULATORY_CARE_PROVIDER_SITE_OTHER): Payer: Medicare HMO | Admitting: Internal Medicine

## 2017-05-14 VITALS — BP 158/88 | HR 88 | Temp 98.3°F | Wt 133.0 lb

## 2017-05-14 DIAGNOSIS — B182 Chronic viral hepatitis C: Secondary | ICD-10-CM

## 2017-05-14 DIAGNOSIS — B20 Human immunodeficiency virus [HIV] disease: Secondary | ICD-10-CM

## 2017-05-14 MED ORDER — ELVITEG-COBIC-EMTRICIT-TENOFAF 150-150-200-10 MG PO TABS: 1.0000 | ORAL_TABLET | Freq: Every day | ORAL | 11 refills | Status: AC

## 2017-05-14 MED ORDER — ENSURE PO LIQD: 237.0000 mL | Freq: Two times a day (BID) | ORAL | 5 refills | Status: DC
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
## 2017-05-22 ENCOUNTER — Ambulatory Visit (HOSPITAL_BASED_OUTPATIENT_CLINIC_OR_DEPARTMENT_OTHER): Payer: Medicare HMO

## 2017-09-22 ENCOUNTER — Ambulatory Visit: Payer: Medicare HMO | Admitting: Internal Medicine

## 2017-09-22 ENCOUNTER — Other Ambulatory Visit: Payer: Self-pay

## 2017-11-07 ENCOUNTER — Telehealth: Payer: Self-pay | Admitting: *Deleted

## 2017-11-07 NOTE — Telephone Encounter (Signed)
RN received a call from patient's [facility] Case Manager, Meagan. Patient is on Ensure through [facility]. He needs a diagnosis in his problem list regarding the need for supplemental nutrition. Please adjust Problem List as appropriate if he is to continue Ensure. Landis Gandy, RN

## 2017-11-18 ENCOUNTER — Other Ambulatory Visit: Payer: Self-pay | Admitting: *Deleted

## 2017-11-18 ENCOUNTER — Other Ambulatory Visit: Payer: Medicare Other

## 2017-11-18 DIAGNOSIS — B20 Human immunodeficiency virus [HIV] disease: Secondary | ICD-10-CM

## 2017-11-19 LAB — T-HELPER CELL (CD4) - (RCID CLINIC ONLY)
CD4 % Helper T Cell: 35 % (ref 33–55)
CD4 T CELL ABS: 490 /uL (ref 400–2700)

## 2017-11-20 LAB — COMPLETE METABOLIC PANEL WITH GFR
AG RATIO: 1.1 (calc) (ref 1.0–2.5)
ALKALINE PHOSPHATASE (APISO): 94 U/L (ref 40–115)
ALT: 43 U/L (ref 9–46)
AST: 83 U/L — AB (ref 10–35)
Albumin: 4.4 g/dL (ref 3.6–5.1)
BILIRUBIN TOTAL: 0.6 mg/dL (ref 0.2–1.2)
BUN: 10 mg/dL (ref 7–25)
CHLORIDE: 96 mmol/L — AB (ref 98–110)
CO2: 28 mmol/L (ref 20–32)
Calcium: 9.6 mg/dL (ref 8.6–10.3)
Creat: 1.19 mg/dL (ref 0.70–1.25)
GFR, EST NON AFRICAN AMERICAN: 63 mL/min/{1.73_m2} (ref >=60)
GFR, Est African American: 73 mL/min/{1.73_m2} (ref >=60)
GLOBULIN: 4 g/dL — AB (ref 1.9–3.7)
Glucose, Bld: 193 mg/dL — ABNORMAL HIGH (ref 65–99)
POTASSIUM: 3.1 mmol/L — AB (ref 3.5–5.3)
SODIUM: 135 mmol/L (ref 135–146)
Total Protein: 8.4 g/dL — ABNORMAL HIGH (ref 6.1–8.1)

## 2017-11-20 LAB — CBC WITH DIFFERENTIAL/PLATELET
BASOS ABS: 51 {cells}/uL (ref 0–200)
Basophils Relative: 0.7 %
Eosinophils Absolute: 88 cells/uL (ref 15–500)
Eosinophils Relative: 1.2 %
HCT: 34.4 % — ABNORMAL LOW (ref 38.5–50.0)
Hemoglobin: 12 g/dL — ABNORMAL LOW (ref 13.2–17.1)
Lymphs Abs: 1424 cells/uL (ref 850–3900)
MCH: 31.8 pg (ref 27.0–33.0)
MCHC: 34.9 g/dL (ref 32.0–36.0)
MCV: 91.2 fL (ref 80.0–100.0)
MPV: 12.1 fL (ref 7.5–12.5)
Monocytes Relative: 13.9 %
NEUTROS PCT: 64.7 %
Neutro Abs: 4723 cells/uL (ref 1500–7800)
PLATELETS: 259 10*3/uL (ref 140–400)
RBC: 3.77 10*6/uL — ABNORMAL LOW (ref 4.20–5.80)
RDW: 16.9 % — ABNORMAL HIGH (ref 11.0–15.0)
TOTAL LYMPHOCYTE: 19.5 %
WBC: 7.3 10*3/uL (ref 3.8–10.8)
WBCMIX: 1015 {cells}/uL — AB (ref 200–950)

## 2017-11-20 LAB — HIV-1 RNA QUANT-NO REFLEX-BLD
HIV 1 RNA QUANT: NOT DETECTED {copies}/mL
HIV-1 RNA QUANT, LOG: NOT DETECTED {Log_copies}/mL

## 2017-11-26 ENCOUNTER — Other Ambulatory Visit: Payer: Self-pay | Admitting: Internal Medicine

## 2017-11-26 ENCOUNTER — Telehealth: Payer: Self-pay

## 2017-11-26 DIAGNOSIS — E440 Moderate protein-calorie malnutrition: Secondary | ICD-10-CM

## 2017-11-26 NOTE — Telephone Encounter (Signed)
[facility] case manager asked if Dr. Baxter Flattery would be able to add weight loss diagnosis in problem list for patient to continue receiving ensure.  [place]

## 2017-11-27 ENCOUNTER — Ambulatory Visit: Payer: Medicare Other | Admitting: Internal Medicine

## 2017-12-18 ENCOUNTER — Other Ambulatory Visit: Payer: Self-pay

## 2017-12-18 ENCOUNTER — Telehealth: Payer: Self-pay

## 2017-12-18 DIAGNOSIS — E440 Moderate protein-calorie malnutrition: Secondary | ICD-10-CM

## 2017-12-18 MED ORDER — ENSURE PO LIQD: 237.0000 mL | Freq: Two times a day (BID) | ORAL | 5 refills | Status: AC

## 2017-12-18 NOTE — Telephone Encounter (Signed)
Patient called today requesting refill on Ensure. Patient states that [facility] in [facility] [place] has attempted to call office to have script signed and faxed to their office. Will print script and fax today. Patient also wanted to reschedule a missed appointment with Dr. Baxter Flattery. Patient will come see Janene Madeira, Np for follow-up appointment. [place]

## 2017-12-23 ENCOUNTER — Ambulatory Visit: Payer: Medicare Other | Admitting: Infectious Diseases

## 2017-12-24 NOTE — Telephone Encounter (Signed)
Patient called today stating he is unable to get his Ensure from [facility]. Stated a plan of care is required from out office before any would be able to be dispensed from case manager. Spoke with Megan Case Manager who was able to confirm what patient stated. Will ask Dr. Baxter Flattery if she is able to put a plan of care for patient to receive his ensure.  [place]

## 2018-01-01 ENCOUNTER — Telehealth: Payer: Self-pay | Admitting: Behavioral Health

## 2018-01-01 NOTE — Telephone Encounter (Signed)
Please called stating he is in need of Ensure.  Per [facility] they need Ensure needs to be added to the plan of care so patient can receive Ensure.  Patient has an upcoming appointment 01/13/2018. Pricilla Riffle RN

## 2018-01-06 NOTE — Telephone Encounter (Signed)
George Allen called office back regarding his Ensure. [facility] is unable to fill script until plan of care is placed for patient. Will route message to Dr. Baxter Flattery. [place]

## 2018-01-13 ENCOUNTER — Ambulatory Visit: Payer: Medicare Other | Admitting: Infectious Diseases

## 2018-01-27 ENCOUNTER — Telehealth: Payer: Self-pay | Admitting: *Deleted

## 2018-01-27 NOTE — Telephone Encounter (Signed)
Patient requesting refill of ensure. In order for [facility] to fill this, patient needs medical diagnosis on his problem list. Patient's weight is 142lb (bmi = 25). Please advise/place medical diagnosis for ensure on problem list if appropriate. Landis Gandy, RN

## 2018-03-09 ENCOUNTER — Encounter: Payer: Self-pay | Admitting: Internal Medicine

## 2018-03-09 ENCOUNTER — Ambulatory Visit (INDEPENDENT_AMBULATORY_CARE_PROVIDER_SITE_OTHER): Payer: Medicare Other | Admitting: Internal Medicine

## 2018-03-09 VITALS — BP 172/77 | HR 71 | Temp 98.1°F | Wt 151.0 lb

## 2018-03-09 DIAGNOSIS — Z79899 Other long term (current) drug therapy: Secondary | ICD-10-CM

## 2018-03-09 DIAGNOSIS — B20 Human immunodeficiency virus [HIV] disease: Secondary | ICD-10-CM

## 2018-03-09 DIAGNOSIS — B182 Chronic viral hepatitis C: Secondary | ICD-10-CM | POA: Diagnosis not present
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
## 2018-05-03 DEATH — deceased

## 2018-09-07 ENCOUNTER — Ambulatory Visit: Payer: Medicare Other | Admitting: Internal Medicine

## 2021-03-29 ENCOUNTER — Telehealth: Payer: Self-pay

## 2021-03-29 NOTE — Telephone Encounter (Signed)
Patient last seen 03/2018 - called patient to offer appointment, call could not be completed.  Beryle Flock, RN

## 2021-06-14 ENCOUNTER — Telehealth: Payer: Self-pay

## 2021-06-14 NOTE — Telephone Encounter (Signed)
Called patient to offer appointment, no answer and unable to leave message.  ? ?Beryle Flock, RN ? ?
